# Patient Record
Sex: Female | Born: 1945 | Race: Black or African American | Hispanic: No | Marital: Married | Smoking: Never smoker
Health system: Southern US, Community
[De-identification: ages and names within clinical notes are randomized; demographics above are authoritative.]

## PROBLEM LIST (undated history)

## (undated) DIAGNOSIS — E079 Disorder of thyroid, unspecified: Secondary | ICD-10-CM

## (undated) DIAGNOSIS — F329 Major depressive disorder, single episode, unspecified: Secondary | ICD-10-CM

## (undated) DIAGNOSIS — M1990 Unspecified osteoarthritis, unspecified site: Secondary | ICD-10-CM

## (undated) DIAGNOSIS — R7303 Prediabetes: Secondary | ICD-10-CM

## (undated) DIAGNOSIS — F32A Depression, unspecified: Secondary | ICD-10-CM

## (undated) DIAGNOSIS — F419 Anxiety disorder, unspecified: Secondary | ICD-10-CM

## (undated) DIAGNOSIS — E785 Hyperlipidemia, unspecified: Secondary | ICD-10-CM

## (undated) DIAGNOSIS — I10 Essential (primary) hypertension: Secondary | ICD-10-CM

## (undated) HISTORY — DX: Depression, unspecified: F32.A

## (undated) HISTORY — PX: EYE SURGERY: SHX253

## (undated) HISTORY — PX: CORNEAL TRANSPLANT: SHX108

## (undated) HISTORY — PX: OTHER SURGICAL HISTORY: SHX169

## (undated) HISTORY — PX: TONSILLECTOMY: SUR1361

## (undated) HISTORY — DX: Major depressive disorder, single episode, unspecified: F32.9

## (undated) HISTORY — PX: THYROID SURGERY: SHX805

## (undated) HISTORY — PX: TUMOR REMOVAL: SHX12

## (undated) HISTORY — PX: ABDOMINAL HYSTERECTOMY: SHX81

## (undated) HISTORY — DX: Disorder of thyroid, unspecified: E07.9

---

## 1999-07-24 ENCOUNTER — Encounter: Payer: Self-pay | Admitting: Family Medicine

## 1999-07-24 ENCOUNTER — Ambulatory Visit (HOSPITAL_COMMUNITY): Admission: RE | Admit: 1999-07-24 | Discharge: 1999-07-24 | Payer: Self-pay | Admitting: Family Medicine

## 1999-07-25 ENCOUNTER — Ambulatory Visit (HOSPITAL_COMMUNITY): Admission: RE | Admit: 1999-07-25 | Discharge: 1999-07-25 | Payer: Self-pay | Admitting: Family Medicine

## 1999-07-25 ENCOUNTER — Encounter: Payer: Self-pay | Admitting: Family Medicine

## 1999-08-28 ENCOUNTER — Encounter: Payer: Self-pay | Admitting: *Deleted

## 1999-08-28 ENCOUNTER — Encounter: Admission: RE | Admit: 1999-08-28 | Discharge: 1999-08-28 | Payer: Self-pay | Admitting: *Deleted

## 1999-08-29 ENCOUNTER — Encounter: Payer: Self-pay | Admitting: Thoracic Surgery

## 1999-09-01 ENCOUNTER — Encounter: Payer: Self-pay | Admitting: Thoracic Surgery

## 1999-09-01 ENCOUNTER — Encounter (INDEPENDENT_AMBULATORY_CARE_PROVIDER_SITE_OTHER): Payer: Self-pay | Admitting: *Deleted

## 1999-09-01 ENCOUNTER — Inpatient Hospital Stay (HOSPITAL_COMMUNITY): Admission: RE | Admit: 1999-09-01 | Discharge: 1999-09-06 | Payer: Self-pay | Admitting: Thoracic Surgery

## 1999-09-03 ENCOUNTER — Encounter: Payer: Self-pay | Admitting: Thoracic Surgery

## 1999-09-04 ENCOUNTER — Encounter: Payer: Self-pay | Admitting: Thoracic Surgery

## 1999-09-05 ENCOUNTER — Encounter: Payer: Self-pay | Admitting: Thoracic Surgery

## 1999-09-10 ENCOUNTER — Encounter: Payer: Self-pay | Admitting: Thoracic Surgery

## 1999-09-10 ENCOUNTER — Encounter: Admission: RE | Admit: 1999-09-10 | Discharge: 1999-09-10 | Payer: Self-pay | Admitting: Thoracic Surgery

## 1999-10-03 ENCOUNTER — Encounter: Admission: RE | Admit: 1999-10-03 | Discharge: 1999-10-03 | Payer: Self-pay | Admitting: Thoracic Surgery

## 1999-10-03 ENCOUNTER — Encounter: Payer: Self-pay | Admitting: Thoracic Surgery

## 1999-11-26 ENCOUNTER — Encounter: Admission: RE | Admit: 1999-11-26 | Discharge: 1999-11-26 | Payer: Self-pay | Admitting: Thoracic Surgery

## 1999-11-26 ENCOUNTER — Encounter: Payer: Self-pay | Admitting: Thoracic Surgery

## 2000-01-14 ENCOUNTER — Encounter: Payer: Self-pay | Admitting: Thoracic Surgery

## 2000-01-14 ENCOUNTER — Encounter: Admission: RE | Admit: 2000-01-14 | Discharge: 2000-01-14 | Payer: Self-pay | Admitting: Thoracic Surgery

## 2000-02-24 ENCOUNTER — Encounter: Admission: RE | Admit: 2000-02-24 | Discharge: 2000-02-24 | Payer: Self-pay | Admitting: Thoracic Surgery

## 2000-02-24 ENCOUNTER — Encounter: Payer: Self-pay | Admitting: Thoracic Surgery

## 2000-06-23 ENCOUNTER — Encounter: Admission: RE | Admit: 2000-06-23 | Discharge: 2000-06-23 | Payer: Self-pay | Admitting: Thoracic Surgery

## 2000-06-23 ENCOUNTER — Encounter: Payer: Self-pay | Admitting: Thoracic Surgery

## 2000-09-13 ENCOUNTER — Encounter: Admission: RE | Admit: 2000-09-13 | Discharge: 2000-09-13 | Payer: Self-pay | Admitting: *Deleted

## 2000-09-13 ENCOUNTER — Encounter: Payer: Self-pay | Admitting: *Deleted

## 2001-07-25 ENCOUNTER — Other Ambulatory Visit: Admission: RE | Admit: 2001-07-25 | Discharge: 2001-07-25 | Payer: Self-pay | Admitting: Obstetrics and Gynecology

## 2001-09-14 ENCOUNTER — Encounter: Admission: RE | Admit: 2001-09-14 | Discharge: 2001-09-14 | Payer: Self-pay | Admitting: Obstetrics and Gynecology

## 2001-09-14 ENCOUNTER — Encounter: Payer: Self-pay | Admitting: Obstetrics and Gynecology

## 2001-12-12 ENCOUNTER — Encounter: Admission: RE | Admit: 2001-12-12 | Discharge: 2002-03-12 | Payer: Self-pay | Admitting: Family Medicine

## 2002-08-15 ENCOUNTER — Other Ambulatory Visit: Admission: RE | Admit: 2002-08-15 | Discharge: 2002-08-15 | Payer: Self-pay | Admitting: Obstetrics and Gynecology

## 2002-09-26 ENCOUNTER — Encounter: Payer: Self-pay | Admitting: Obstetrics and Gynecology

## 2002-09-26 ENCOUNTER — Ambulatory Visit (HOSPITAL_COMMUNITY): Admission: RE | Admit: 2002-09-26 | Discharge: 2002-09-26 | Payer: Self-pay | Admitting: Obstetrics and Gynecology

## 2002-12-27 ENCOUNTER — Ambulatory Visit (HOSPITAL_COMMUNITY): Admission: RE | Admit: 2002-12-27 | Discharge: 2002-12-27 | Payer: Self-pay | Admitting: Gastroenterology

## 2003-12-11 ENCOUNTER — Other Ambulatory Visit: Admission: RE | Admit: 2003-12-11 | Discharge: 2003-12-11 | Payer: Self-pay | Admitting: Obstetrics and Gynecology

## 2003-12-14 ENCOUNTER — Ambulatory Visit (HOSPITAL_COMMUNITY): Admission: RE | Admit: 2003-12-14 | Discharge: 2003-12-14 | Payer: Self-pay | Admitting: Obstetrics and Gynecology

## 2004-12-30 ENCOUNTER — Other Ambulatory Visit: Admission: RE | Admit: 2004-12-30 | Discharge: 2004-12-30 | Payer: Self-pay | Admitting: Obstetrics and Gynecology

## 2005-01-07 ENCOUNTER — Ambulatory Visit (HOSPITAL_COMMUNITY): Admission: RE | Admit: 2005-01-07 | Discharge: 2005-01-07 | Payer: Self-pay | Admitting: Obstetrics and Gynecology

## 2006-01-26 ENCOUNTER — Other Ambulatory Visit: Admission: RE | Admit: 2006-01-26 | Discharge: 2006-01-26 | Payer: Self-pay | Admitting: Obstetrics and Gynecology

## 2006-02-16 ENCOUNTER — Ambulatory Visit (HOSPITAL_COMMUNITY): Admission: RE | Admit: 2006-02-16 | Discharge: 2006-02-16 | Payer: Self-pay | Admitting: Obstetrics and Gynecology

## 2006-11-17 ENCOUNTER — Encounter: Admission: RE | Admit: 2006-11-17 | Discharge: 2006-11-17 | Payer: Self-pay

## 2007-02-21 ENCOUNTER — Ambulatory Visit (HOSPITAL_COMMUNITY): Admission: RE | Admit: 2007-02-21 | Discharge: 2007-02-21 | Payer: Self-pay | Admitting: Obstetrics and Gynecology

## 2008-04-04 ENCOUNTER — Ambulatory Visit (HOSPITAL_COMMUNITY): Admission: RE | Admit: 2008-04-04 | Discharge: 2008-04-04 | Payer: Self-pay | Admitting: Obstetrics and Gynecology

## 2009-06-11 ENCOUNTER — Ambulatory Visit (HOSPITAL_COMMUNITY): Admission: RE | Admit: 2009-06-11 | Discharge: 2009-06-11 | Payer: Self-pay | Admitting: Obstetrics and Gynecology

## 2010-09-26 NOTE — Op Note (Signed)
[facility]  Patient:    Virginia Becker, Virginia Becker                      MRN: [id number] Proc. Date: 09/01/99 Adm. Date:  [id number] Attending:  Cameron Proud CC:         Norton Blizzard, M.D. (2 copies)             Stacie Glaze, M.D. 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  OPERATION:  Left thoracotomy resection of left posterior mediastinal mass.  SURGEON:  D. Karle Plumber, M.D.  ASSISTANT:  Eugenia Pancoast, P.A.  ANESTHESIA:  General anesthesia.  DESCRIPTION OF PROCEDURE:  After percutaneous insertion of all monitor lines, the patient underwent general anesthesia.  She was turned tothe left lateral thoracotomy position.  Dual lumen tube was inserted.  Two trocar sites were made in the anterior and posterior axillary line at the seventh intercostal space, and 0 degree scope was inserted, and a large posterior mediastinal mass was noted.  The scope was removed.  A posterolateral thoracotomy was performed, and dissection was carried down through the subcutaneous tissues down to the latissimus dorsi which was divided with electrocautery, and the serratus was reflected anteriorly.  The portion of the sixth rib was taken posteriorly subpericosteally.  A Finochietto retractor was inserted, and the large 9 to 10 cm lesion was seen between T4 and T6.  Dissection was started posteriorly with electrocautery, removing off the fourth, fifth, and sixth ribs and reflecting them anteriorly toward the aorta.  Then dissection was started from the aorta laterally, dissecting it off again down to the fourth, fifth, and sixth ribs and then superiorly.  It appeared to probably come off the fourth intercostal nerve, and this was resected out and removed in total. All bleeding was electrocoagulated.  Several ligation clips  were placed in the area of the resection.  Two intercostal vessels were clipped with ligation clips, and one of them was oversewn with 4-0 silk.  Two chest tubes were placed through the trocar sites and tied in place with 0 silk.  The chest was closed with four pericostals, #1 Dexon in the muscle layer, and Ethicon skin clips.  The patient returned to the recovery room in stable condition. DD:  09/01/99 TD:  09/01/99 Job: [id number]

## 2010-09-26 NOTE — Procedures (Signed)
[place]  Patient:    Virginia Becker, Virginia Becker                      MRN: [id number] Proc. Date: 09/01/99 Adm. Date:  [id number] Attending:  Cameron Proud CC:         Anesthesia Dept                           Procedure Report  PROCEDURE PERFORMED:  Insertion of epidural catheter for postoperative analgesia.  ANESTHESIOLOGIST:  Cliffton Asters. Crews, M.D.  DESCRIPTION OF PROCEDURE:  The patient was brought to the operating room by Dr. Edwyna Shell for resection of a lung mass and/or posterior mediastinal mass. She understood the risks and benefits of epidural analgesia as explained to her by me including those of infection, subarachnoid or intravenous injection and nerve damage.  She wished to have the procedure performed postoperatively.  At the end of the procedure she was maintained in the right lateral position. Her back was prepped in sterile fashion.  At the T9-10 interspace, a 17 gauge Tuohy needle was passed to a loss of resistance to normal saline 3 cc.  There was a negative aspiration of blood or CSF and an 18 gauge epidural catheter was passed through the needle to a depth of 4 cm below the end of the needle. The needle was removed and the catheter was secured.  There was a negative aspiration of blood or CSF from the catheter and a bolus injection of fentanyl 100 mcg in a solution of 0.5% lidocaine was given through the catheter.  The patient was turned supine, suctioned and extubated uneventfully in the operating room.  She will be followed by the anesthesia department for several days until appropriate removal of her epidural catheter is accomplished. DD:  09/01/99 TD:  09/02/99 Job: [id number]

## 2010-09-26 NOTE — Op Note (Signed)
NAME:  Virginia Becker, Virginia Becker                         ACCOUNT NO.:  [id number]   MEDICAL RECORD NO.:  [id number]                   PATIENT TYPE:  AMB   LOCATION:  ENDO                                 FACILITY:  [facility]   PHYSICIAN:  Anselmo Rod, M.D.               DATE OF BIRTH:  [birth year]   DATE OF PROCEDURE:  12/27/2002  DATE OF DISCHARGE:                                 OPERATIVE REPORT   PROCEDURE PERFORMED:  Screening colonoscopy.   ENDOSCOPIST:  Charna Elizabeth, M.D.   INSTRUMENT USED:  Olympus video colonoscope.   INDICATIONS FOR PROCEDURE:  The patient is a 65 year old African-American  female undergoing screening colonoscopy to rule out colonic polyps, masses,  etc.   PREPROCEDURE PREPARATION:  Informed consent was procured from the patient.  The patient was fasted for eight hours prior to the procedure and prepped  with a bottle of magnesium citrate and a gallon of GoLYTELY the night prior  to the procedure.   PREPROCEDURE PHYSICAL:  The patient had stable vital signs.  Neck supple.  Chest clear to auscultation.  S1 and S2 regular.  Abdomen soft with normal  bowel sounds.   DESCRIPTION OF PROCEDURE:  The patient was placed in left lateral decubitus  position and sedated with 60 mg of Demerol and 6 mg of Versed intravenously.  Once the patient was adequately sedated and maintained on low flow oxygen  and continuous cardiac monitoring, the Olympus video colonoscope was  advanced from the rectum to the cecum without difficulty.  There was some  residual stool in the colon.  Multiple washes were done. The patient's  position was changed from the left lateral to the supine position to move  the stool.  The appendicular orifice and ileocecal valve were visualized and  photographed.  The terminal ileum appeared normal.  No masses or polyps were  seen.  There were a few early scattered diverticula seen throughout the  colon.  Small internal hemorrhoids were appreciated on  retroflexion in the  rectum.  The patient tolerated the procedure well without complication.   IMPRESSION:  1. Small nonbleeding internal hemorrhoids.  2. Early scattered diverticulosis.  3. No masses or polyps seen.  4. Normal-appearing terminal ileum.   RECOMMENDATIONS:  1. A high fiber diet with liberal fluid intake has been advocated.  2. Repeat colorectal cancer screening is recommended in the next 10 years     unless the patient develops any abnormal symptoms in the interim.  3. Outpatient follow-up in the next two weeks or earlier if need arises.                                                   Anselmo Rod, M.D.    JNM/MEDQ  D:  12/27/2002  T:  12/27/2002  Job:  [id number]   cc:   Naima A. Normand Sloop, M.D.  [address]  [place]  [place] [address]  Fax: [phone]   Jethro Bastos, M.D.  [address]  [place]  [place] [address]  Fax: [phone]

## 2010-09-26 NOTE — Discharge Summary (Signed)
[place]. [facility]  Patient:    Virginia Becker, Virginia Becker                      MRN: [id number] Adm. Date:  [id number] Attending:  Cameron Proud Dictator:   Sherrie George, P.A. CC:         Cristi Loron, M.D.             Joycelyn Rua, M.D.                           Discharge Summary  DATE OF BIRTH:  Feb 09, 1946  ADMITTING DIAGNOSES: 1. Left lobe lesion, rule out neurogenic tumor, left chest. 2. Hypothyroidism. 3. Hypercholesterolemia.  DISCHARGE DIAGNOSES: 1. Schwannoma, left chest. 2. Hypothyroidism. 3. Hypercholesterolemia.  PROCEDURES:  Left video-assisted thoracoscopy, left thoracotomy with resection of tumor from the left chest wall, September 01, 1999, D. Karle Plumber, M.D.  HISTORY OF PRESENT ILLNESS:  The patient is a 65 year old black female medical patient of Cristi Loron, M.D., and Joycelyn Rua, M.D.  The patient presented with left flank pain.  Workup including CT revealed a left lobe lesion which was thought to be a possible neurogenic tumor.  MRI revealed a 7 to 8 cm left lower lobe lesion and possible neural foramen impingement.  She had no history of tobacco use.  She occasionally uses alcohol.  She had been asymptomatic except for the flank pain.  She has a history of hypothyroidism and an elevated cholesterol.  She is on Synthroid, had no allergies.  She is referred to [facility] and Dr. Karle Plumber.  It was his opinion that it was most likely a neurogenic tumor.  It does not enter the neural foramen though it may require neurosurgeon at the time of surgery.  The best treatment is to proceed with a left thoracotomy and resection of the tumor.  MRI does show good plane, so it was his opinion that it should be fairly easy.  Given the size, it will require a full thoracotomy.  The risks and benefits were discussed in detail and patient was admitted electively at this time to undergo a resection of the tumor.  PAST  MEDICAL HISTORY:  As noted above, included hypothyroidism and hypercholesterolemia.  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  CURRENT MEDICATIONS:  Synthroid 0.175 mg p.o. q.d.  ALLERGIES:  None.  She does note some mild LACTOSE INTOLERANCE.  For further history and physical, please see the dictated note.  HOSPITAL COURSE:  Patient was admitted, taken to the operating room, and underwent the procedure as described above.  She tolerated it well and returned to the recovery room and then transferred to 3300 in satisfactory condition.  She remained hemodynamically stable overnight.  She had a low-grade temperature of 101 the postoperative evening.  The following morning, it was 100.  O2 saturations were 94% on room air.  She was hyponatremic and hypokalemic, and these have been appropriately treated. Hemoglobin was 10.7, hematocrit was 31.  White count was 8600, platelets were 216,000.  Overall, she looked quite good.  Her chest tube was removed, ART line was removed, and she was mobilized.  Pain control was managed with an epidural drip which was placed postoperatively by anesthesia.  She did well with this and the epidural was discontinued on  the second hospital day.  On April 25, the second postoperative day, the chest tube was removed and she was mobilized further.  Foley was discontinued.  By April 26, chest x-ray was stable.  She continued to do well and, by September 05, 1999, it was Dr. Remo Lipps opinion that she had made excellent improvement.  She was transferred to 6700 and plans were made for her to go home on April 28 or April 29.  The pathology showed a schwannoma and the tumor was adjacent to the inked edges but had a round border and, therefore, it appeared to be completely excised.  Patient has done well, has had no further problems, and, as noted above, will aim for discharge in  the next 24 to 48 hours.  DISCHARGE MEDICATIONS:  Patient was on Synthroid and some vitamins at home. She may resume those, Synthroid dose 0.175 mg per day, and she will also go home on Tylox one to two p.o. q.4h. p.r.n.  FOLLOW-UP:  She will return to see Dr. Edwyna Shell in one week, on Wednesday, May 2, at 3:10, with a chest x-ray from the [facility].  She will return to see Dr. Lenise Arena and Dr. Lovell Sheehan at their discretion.  CONDITION ON DISCHARGE:  Improving. DD:  09/05/99 TD:  09/06/99 Job: [id number]

## 2011-04-22 ENCOUNTER — Other Ambulatory Visit (HOSPITAL_COMMUNITY): Payer: Self-pay | Admitting: Obstetrics and Gynecology

## 2011-04-22 DIAGNOSIS — Z1231 Encounter for screening mammogram for malignant neoplasm of breast: Secondary | ICD-10-CM

## 2011-05-27 ENCOUNTER — Other Ambulatory Visit (HOSPITAL_COMMUNITY): Payer: Self-pay | Admitting: Obstetrics and Gynecology

## 2011-05-27 ENCOUNTER — Ambulatory Visit (HOSPITAL_COMMUNITY)
Admission: RE | Admit: 2011-05-27 | Discharge: 2011-05-27 | Disposition: A | Discharge status: Home / Self Care | Payer: Medicare Other | Source: Ambulatory Visit | Attending: Obstetrics and Gynecology | Admitting: Obstetrics and Gynecology

## 2011-05-27 DIAGNOSIS — Z1231 Encounter for screening mammogram for malignant neoplasm of breast: Secondary | ICD-10-CM | POA: Insufficient documentation

## 2011-09-24 ENCOUNTER — Ambulatory Visit
Admission: RE | Admit: 2011-09-24 | Discharge: 2011-09-24 | Disposition: A | Discharge status: Home / Self Care | Payer: Medicare Other | Source: Ambulatory Visit | Attending: Family Medicine | Admitting: Family Medicine

## 2011-09-24 ENCOUNTER — Other Ambulatory Visit: Payer: Self-pay | Admitting: Family Medicine

## 2011-09-24 DIAGNOSIS — R51 Headache: Secondary | ICD-10-CM

## 2011-11-29 ENCOUNTER — Other Ambulatory Visit: Payer: Self-pay | Admitting: Obstetrics and Gynecology

## 2011-11-30 NOTE — Telephone Encounter (Signed)
Virginia Becker, can this pt have this rx

## 2011-12-01 ENCOUNTER — Telehealth: Payer: Self-pay

## 2011-12-01 NOTE — Telephone Encounter (Signed)
TC TO PT PHARMACY FOR RX EST ESTROGENS-METHYLTEST HS 0.625-1.25 MG #30 WITH 6 REFILLS DUE TO ESCRIBE ERROR

## 2012-03-22 ENCOUNTER — Other Ambulatory Visit: Payer: Self-pay | Admitting: Obstetrics and Gynecology

## 2012-03-22 ENCOUNTER — Telehealth: Payer: Self-pay | Admitting: Obstetrics and Gynecology

## 2012-03-22 MED ORDER — EST ESTROGENS-METHYLTEST 0.625-1.25 MG PO TABS: 1.0000 | ORAL_TABLET | Freq: Every day | ORAL | Status: DC

## 2012-03-22 NOTE — Telephone Encounter (Signed)
Pt called, states spoke w/ [facility] and was told that Estrace will be running out and they will not have it any more, pt ?s what to do.  Per EP e-prescribed rx to a different pharmacy, advised if there are any issues to there they do not have it, pt to call, pt voices agreement.

## 2012-04-25 ENCOUNTER — Encounter: Payer: Self-pay | Admitting: Obstetrics and Gynecology

## 2012-04-25 ENCOUNTER — Ambulatory Visit (INDEPENDENT_AMBULATORY_CARE_PROVIDER_SITE_OTHER): Payer: Medicare Other | Admitting: Obstetrics and Gynecology

## 2012-04-25 VITALS — BP 126/84 | Wt 181.0 lb

## 2012-04-25 DIAGNOSIS — Z124 Encounter for screening for malignant neoplasm of cervix: Secondary | ICD-10-CM

## 2012-04-25 NOTE — Progress Notes (Signed)
Last Pap: 04-2011 WNL: Yes Regular Periods:no Contraception: post meno  Monthly Breast exam:no Tetanus<87yrs:yes Nl.Bladder Function:yes Daily BMs:yes Healthy Diet:yes Calcium:no Mammogram:yes Date of Mammogram: 04-2011 Exercise:yes Have often Exercise: not often Seatbelt: yes Abuse at home: no Stressful work:yes Sigmoid-colonoscopy: 2006 Bone Density: No PCP: Dr Johnn Hai Change in PMH: no Change in FMH:no BP 126/84  Wt 181 lb (82.101 kg) .ccoPhysical Examination: Neck - supple, no significant adenopathy Chest - clear to auscultation, no wheezes, rales or rhonchi, symmetric air entry Heart - normal rate, regular rhythm, normal S1, S2, no murmurs, rubs, clicks or gallops Abdomen - soft, nontender, nondistended, no masses or organomegaly Breasts - breasts appear normal, no suspicious masses, no skin or nipple changes or axillary nodes Pelvic - normal external genitalia, vulva, vagina, cervix, uterus and adnexa, atrophic Rectal - normal rectal, no masses Musculoskeletal - no joint tenderness, deformity or swelling Extremities - peripheral pulses normal, no pedal edema, no clubbing or cyanosis Skin - normal coloration and turgor, no rashes, no suspicious skin lesions noted Normal AEX Pt due for mammogram yes it wa scheduled colonoscopy due no Pap done no RT one year Diet and exercise discussed Pt given a rx for yell gel to help with desire and orgasm with IC per her request  Rt for bone density scan

## 2012-04-25 NOTE — Patient Instructions (Signed)

## 2012-05-30 ENCOUNTER — Other Ambulatory Visit: Payer: Medicare Other

## 2012-05-31 ENCOUNTER — Ambulatory Visit (HOSPITAL_COMMUNITY)
Admission: RE | Admit: 2012-05-31 | Discharge: 2012-05-31 | Disposition: A | Discharge status: Home / Self Care | Payer: Medicare Other | Source: Ambulatory Visit | Attending: Obstetrics and Gynecology | Admitting: Obstetrics and Gynecology

## 2012-05-31 DIAGNOSIS — Z124 Encounter for screening for malignant neoplasm of cervix: Secondary | ICD-10-CM

## 2012-05-31 DIAGNOSIS — Z1231 Encounter for screening mammogram for malignant neoplasm of breast: Secondary | ICD-10-CM | POA: Insufficient documentation

## 2012-06-07 ENCOUNTER — Other Ambulatory Visit: Payer: Medicare Other

## 2012-06-07 DIAGNOSIS — M949 Disorder of cartilage, unspecified: Secondary | ICD-10-CM

## 2012-06-07 DIAGNOSIS — M899 Disorder of bone, unspecified: Secondary | ICD-10-CM

## 2012-06-08 LAB — VITAMIN D 25 HYDROXY (VIT D DEFICIENCY, FRACTURES): Vit D, 25-Hydroxy: 63 ng/mL (ref 30–89)

## 2012-06-10 ENCOUNTER — Telehealth: Payer: Self-pay

## 2012-06-10 ENCOUNTER — Other Ambulatory Visit: Payer: Self-pay

## 2012-06-10 ENCOUNTER — Telehealth: Payer: Self-pay | Admitting: Obstetrics and Gynecology

## 2012-06-10 MED ORDER — ESTRADIOL 0.1 MG/GM VA CREA: TOPICAL_CREAM | VAGINAL | Status: DC

## 2012-06-10 NOTE — Telephone Encounter (Signed)
Spoke with pt rgd msg pt wants rx for estrace advised pt will consult with nd and call her back pt voice understanding

## 2012-06-10 NOTE — Telephone Encounter (Signed)
Please see ND note

## 2012-06-10 NOTE — Telephone Encounter (Signed)
Spoke with pt rgd previous call informed rx sent to pharm f/u 3-6 months pt voice understanding

## 2012-06-10 NOTE — Telephone Encounter (Signed)
Lm on vm tcb rgd msg 

## 2012-06-10 NOTE — Telephone Encounter (Signed)
Pt may have Estrace vaginal cream.  One applicator QHS for one week.  Then QOD for one week.  Then 3 times a week for a total of 6 weeks.  Repeat q 2-3 months PRN 3 refill f/u 3-6 months

## 2012-06-10 NOTE — Telephone Encounter (Signed)
rx sent to pharm

## 2012-06-15 ENCOUNTER — Other Ambulatory Visit: Payer: Self-pay | Admitting: Obstetrics and Gynecology

## 2012-06-17 ENCOUNTER — Other Ambulatory Visit: Payer: Self-pay | Admitting: Obstetrics and Gynecology

## 2012-06-17 NOTE — Telephone Encounter (Signed)
Can this pt have this rx? 

## 2012-06-17 NOTE — Telephone Encounter (Signed)
Can this pt have this rx?

## 2012-06-21 ENCOUNTER — Telehealth: Payer: Self-pay

## 2012-06-21 ENCOUNTER — Telehealth: Payer: Self-pay | Admitting: Obstetrics and Gynecology

## 2012-06-21 MED ORDER — ESTRADIOL 0.1 MG/GM VA CREA: TOPICAL_CREAM | VAGINAL | Status: DC

## 2012-06-21 MED ORDER — EST ESTROGENS-METHYLTEST 0.625-1.25 MG PO TABS: 1.0000 | ORAL_TABLET | Freq: Every day | ORAL | Status: DC

## 2012-06-21 NOTE — Telephone Encounter (Signed)
Pt aware of rx being sent to [facility]  Darien Ramus, CMA

## 2012-06-21 NOTE — Telephone Encounter (Signed)
VM from pt needing tablets and not cream.   Sent to pharmacy.  pt advised

## 2012-06-21 NOTE — Telephone Encounter (Signed)
Please see msg and advise thanks 

## 2012-06-22 ENCOUNTER — Telehealth: Payer: Self-pay | Admitting: Obstetrics and Gynecology

## 2012-06-22 NOTE — Telephone Encounter (Signed)
rx called to pharmacy 

## 2013-07-18 ENCOUNTER — Ambulatory Visit
Admission: RE | Admit: 2013-07-18 | Discharge: 2013-07-18 | Disposition: A | Discharge status: Home / Self Care | Payer: Medicare Other | Source: Ambulatory Visit | Attending: Family Medicine | Admitting: Family Medicine

## 2013-07-18 ENCOUNTER — Encounter (HOSPITAL_COMMUNITY): Payer: Self-pay | Admitting: Emergency Medicine

## 2013-07-18 ENCOUNTER — Other Ambulatory Visit: Payer: Self-pay | Admitting: Family Medicine

## 2013-07-18 ENCOUNTER — Inpatient Hospital Stay (HOSPITAL_COMMUNITY)
Admission: EM | Admit: 2013-07-18 | Discharge: 2013-07-23 | DRG: 392 | Disposition: A | Discharge status: Home / Self Care | Payer: Medicare Other | Attending: Internal Medicine | Admitting: Internal Medicine

## 2013-07-18 DIAGNOSIS — R1032 Left lower quadrant pain: Secondary | ICD-10-CM

## 2013-07-18 DIAGNOSIS — K5780 Diverticulitis of intestine, part unspecified, with perforation and abscess without bleeding: Secondary | ICD-10-CM | POA: Diagnosis present

## 2013-07-18 DIAGNOSIS — K648 Other hemorrhoids: Secondary | ICD-10-CM | POA: Diagnosis present

## 2013-07-18 DIAGNOSIS — N390 Urinary tract infection, site not specified: Secondary | ICD-10-CM | POA: Diagnosis present

## 2013-07-18 DIAGNOSIS — K630 Abscess of intestine: Secondary | ICD-10-CM | POA: Diagnosis present

## 2013-07-18 DIAGNOSIS — F3289 Other specified depressive episodes: Secondary | ICD-10-CM | POA: Diagnosis present

## 2013-07-18 DIAGNOSIS — R109 Unspecified abdominal pain: Secondary | ICD-10-CM

## 2013-07-18 DIAGNOSIS — D72829 Elevated white blood cell count, unspecified: Secondary | ICD-10-CM

## 2013-07-18 DIAGNOSIS — I809 Phlebitis and thrombophlebitis of unspecified site: Secondary | ICD-10-CM | POA: Diagnosis not present

## 2013-07-18 DIAGNOSIS — K5792 Diverticulitis of intestine, part unspecified, without perforation or abscess without bleeding: Secondary | ICD-10-CM

## 2013-07-18 DIAGNOSIS — K5732 Diverticulitis of large intestine without perforation or abscess without bleeding: Principal | ICD-10-CM | POA: Diagnosis present

## 2013-07-18 DIAGNOSIS — E039 Hypothyroidism, unspecified: Secondary | ICD-10-CM | POA: Diagnosis present

## 2013-07-18 DIAGNOSIS — F329 Major depressive disorder, single episode, unspecified: Secondary | ICD-10-CM | POA: Diagnosis present

## 2013-07-18 LAB — COMPREHENSIVE METABOLIC PANEL
ALK PHOS: 65 U/L (ref 39–117)
ALT: 31 U/L (ref 0–35)
AST: 26 U/L (ref 0–37)
Albumin: 3.7 g/dL (ref 3.5–5.2)
BILIRUBIN TOTAL: 0.6 mg/dL (ref 0.3–1.2)
BUN: 10 mg/dL (ref 6–23)
CO2: 27 mEq/L (ref 19–32)
CREATININE: 0.93 mg/dL (ref 0.50–1.10)
Calcium: 9.5 mg/dL (ref 8.4–10.5)
Chloride: 97 mEq/L (ref 96–112)
GFR calc Af Amer: 72 mL/min — ABNORMAL LOW (ref >90)
GFR calc non Af Amer: 62 mL/min — ABNORMAL LOW (ref >90)
Glucose, Bld: 124 mg/dL — ABNORMAL HIGH (ref 70–99)
POTASSIUM: 4.4 meq/L (ref 3.7–5.3)
Sodium: 139 mEq/L (ref 137–147)
TOTAL PROTEIN: 7.4 g/dL (ref 6.0–8.3)

## 2013-07-18 LAB — CBC WITH DIFFERENTIAL/PLATELET
BASOS PCT: 0 % (ref 0–1)
Basophils Absolute: 0 10*3/uL (ref 0.0–0.1)
Eosinophils Absolute: 0 10*3/uL (ref 0.0–0.7)
Eosinophils Relative: 0 % (ref 0–5)
HEMATOCRIT: 40.7 % (ref 36.0–46.0)
HEMOGLOBIN: 13.7 g/dL (ref 12.0–15.0)
Lymphocytes Relative: 14 % (ref 12–46)
Lymphs Abs: 1.6 10*3/uL (ref 0.7–4.0)
MCH: 27.7 pg (ref 26.0–34.0)
MCHC: 33.7 g/dL (ref 30.0–36.0)
MCV: 82.4 fL (ref 78.0–100.0)
MONO ABS: 1.2 10*3/uL — AB (ref 0.1–1.0)
MONOS PCT: 11 % (ref 3–12)
NEUTROS PCT: 75 % (ref 43–77)
Neutro Abs: 8.2 10*3/uL — ABNORMAL HIGH (ref 1.7–7.7)
Platelets: 228 10*3/uL (ref 150–400)
RBC: 4.94 MIL/uL (ref 3.87–5.11)
RDW: 13.6 % (ref 11.5–15.5)
WBC: 11 10*3/uL — AB (ref 4.0–10.5)

## 2013-07-18 LAB — LIPASE, BLOOD: LIPASE: 9 U/L — AB (ref 11–59)

## 2013-07-18 MED ORDER — MORPHINE SULFATE 2 MG/ML IJ SOLN: 1.0000 mg | INTRAMUSCULAR | Status: DC | PRN

## 2013-07-18 MED ORDER — ACETAMINOPHEN 325 MG PO TABS
650.0000 mg | ORAL_TABLET | Freq: Once | ORAL | Status: AC
  Administered 2013-07-18: 650 mg via ORAL
  Filled 2013-07-18: qty 2

## 2013-07-18 MED ORDER — ENOXAPARIN SODIUM 40 MG/0.4ML ~~LOC~~ SOLN
40.0000 mg | SUBCUTANEOUS | Status: DC
  Administered 2013-07-18 – 2013-07-21 (×4): 40 mg via SUBCUTANEOUS
  Filled 2013-07-18 (×6): qty 0.4

## 2013-07-18 MED ORDER — ATORVASTATIN CALCIUM 40 MG PO TABS
40.0000 mg | ORAL_TABLET | Freq: Every day | ORAL | Status: DC
  Administered 2013-07-18 – 2013-07-23 (×6): 40 mg via ORAL
  Filled 2013-07-18 (×6): qty 1

## 2013-07-18 MED ORDER — METRONIDAZOLE IN NACL 5-0.79 MG/ML-% IV SOLN
500.0000 mg | Freq: Three times a day (TID) | INTRAVENOUS | Status: DC
  Administered 2013-07-18 – 2013-07-22 (×11): 500 mg via INTRAVENOUS
  Filled 2013-07-18 (×13): qty 100

## 2013-07-18 MED ORDER — SODIUM CHLORIDE 0.9 % IV SOLN
INTRAVENOUS | Status: DC
  Administered 2013-07-18 – 2013-07-20 (×2): via INTRAVENOUS

## 2013-07-18 MED ORDER — LEVOTHYROXINE SODIUM 100 MCG PO TABS
100.0000 ug | ORAL_TABLET | Freq: Every day | ORAL | Status: DC
  Administered 2013-07-19 – 2013-07-23 (×4): 100 ug via ORAL
  Filled 2013-07-18 (×8): qty 1

## 2013-07-18 MED ORDER — METRONIDAZOLE IN NACL 5-0.79 MG/ML-% IV SOLN
500.0000 mg | Freq: Once | INTRAVENOUS | Status: AC
  Administered 2013-07-18: 500 mg via INTRAVENOUS
  Filled 2013-07-18: qty 100

## 2013-07-18 MED ORDER — ZOLPIDEM TARTRATE 5 MG PO TABS
5.0000 mg | ORAL_TABLET | Freq: Every evening | ORAL | Status: DC | PRN
  Administered 2013-07-18 – 2013-07-22 (×5): 5 mg via ORAL
  Filled 2013-07-18 (×6): qty 1

## 2013-07-18 MED ORDER — CIPROFLOXACIN IN D5W 400 MG/200ML IV SOLN
400.0000 mg | Freq: Once | INTRAVENOUS | Status: AC
  Administered 2013-07-18: 400 mg via INTRAVENOUS
  Filled 2013-07-18: qty 200

## 2013-07-18 MED ORDER — IOHEXOL 300 MG/ML  SOLN: 100.0000 mL | Freq: Once | INTRAMUSCULAR | Status: DC | PRN

## 2013-07-18 MED ORDER — ONDANSETRON HCL 4 MG PO TABS: 4.0000 mg | ORAL_TABLET | Freq: Four times a day (QID) | ORAL | Status: DC | PRN

## 2013-07-18 MED ORDER — ACETAMINOPHEN 325 MG PO TABS
650.0000 mg | ORAL_TABLET | Freq: Four times a day (QID) | ORAL | Status: DC | PRN
  Administered 2013-07-19 (×2): 650 mg via ORAL
  Filled 2013-07-18 (×2): qty 2

## 2013-07-18 MED ORDER — CIPROFLOXACIN IN D5W 400 MG/200ML IV SOLN
400.0000 mg | Freq: Two times a day (BID) | INTRAVENOUS | Status: DC
  Administered 2013-07-19 – 2013-07-22 (×7): 400 mg via INTRAVENOUS
  Filled 2013-07-18 (×8): qty 200

## 2013-07-18 MED ORDER — CITALOPRAM HYDROBROMIDE 20 MG PO TABS
20.0000 mg | ORAL_TABLET | Freq: Every day | ORAL | Status: DC
  Administered 2013-07-18 – 2013-07-23 (×6): 20 mg via ORAL
  Filled 2013-07-18 (×6): qty 1

## 2013-07-18 MED ORDER — ONDANSETRON HCL 4 MG/2ML IJ SOLN
4.0000 mg | Freq: Four times a day (QID) | INTRAMUSCULAR | Status: DC | PRN
  Administered 2013-07-19: 4 mg via INTRAVENOUS
  Filled 2013-07-18: qty 2

## 2013-07-18 MED ORDER — ACETAMINOPHEN 650 MG RE SUPP: 650.0000 mg | Freq: Four times a day (QID) | RECTAL | Status: DC | PRN

## 2013-07-18 NOTE — ED Notes (Signed)
Pt presents with intermittent LLQ pain x1 week. Pt denies nausea, vomiting, diarrhea or bloody stool. Pt had an abd CT around noon today, her PCP called and told her to come here for further evaluation due to possible "pus pockets" in her abd

## 2013-07-18 NOTE — Consult Note (Signed)
I have seen and examined the patient and agree with the assessment and plans. Hopefully will improve with IV antibiotics and then can get an outpt CT  Margene Cherian A. Magnus IvanBlackman  MD, FACS

## 2013-07-18 NOTE — H&P (Signed)
[facility] History and Physical  Virginia Becker XBM:[id number] DOB: 1946/02/21 DOA: 07/18/2013  Referring physician: er PCP: Emeterio Reeve, MD   Chief Complaint: abd pain- sent in by PCP after CT scan  HPI: Virginia Becker is a 68 y.o. female in good health Who was sent from PCP with c/o LLQ pain- going on for 10 days.  Saturday, she developed a fever.  She was seen by her PCP on Monday who diagnosised her with diverticulitis and was given cipro/flagyl.  Her blood count showed a WBC count.  Her PCP got a CT scan at that point which showed :Extensive wall thickening is noted in the sigmoid colon. Findings are most consistent with severe diverticulitis. Colonic malignancy cannot be excluded. Focal associated area of necrosis and/or developing small abscess noted. No evidence of colonic obstruction over the sigmoid colon is severely narrowed by the above-described process. Left iliac and retroperitoneal lymphadenopathy  No blood in stool but does have diarrhea.  No vomiting- asking to eat Patient had a colonoscopy about 5 years ago with [facility], she thinks she is due for another soon.  In her records, I found a colonscopy in 2004 by Dr. Loreta Ave: IMPRESSION:  1. Small nonbleeding internal hemorrhoids.  2. Early scattered diverticulosis.  3. No masses or polyps seen.  4. Normal-appearing terminal ileum.  RECOMMENDATIONS:  1. A high fiber diet with liberal fluid intake has been advocated.  2. Repeat colorectal cancer screening is recommended in the next 10 years  unless the patient develops any abnormal symptoms in the interim.  3. Outpatient follow-up in the next two weeks or earlier if need arises.    Review of Systems:  All systems reviewed, negative unless stated above  Past Medical History  Diagnosis Date  . Thyroid disease   . Depression    Past Surgical History  Procedure Laterality Date  . Bone spur    . Corneal transplant    . Eye surgery    . Abdominal hysterectomy     . Thyroid surgery    . Tonsillectomy     Social History:  reports that she has never smoked. She has never used smokeless tobacco. She reports that she does not drink alcohol or use illicit drugs.  No Known Allergies  Family History  Problem Relation Age of Onset  . Cancer Mother     pancreatic  . Mental illness Mother   . Heart disease Father   . Mental illness Father   . Hypertension Sister   . Mental illness Sister   . Hypertension Brother   . Breast cancer Maternal Aunt   . Breast cancer Maternal Aunt      Prior to Admission medications   Medication Sig Start Date End Date Taking? Authorizing Provider  atorvastatin (LIPITOR) 40 MG tablet Take 40 mg by mouth daily.   Yes Historical Provider, MD  cholecalciferol (VITAMIN D) 1000 UNITS tablet Take 1,000 Units by mouth daily.   Yes Historical Provider, MD  ciprofloxacin (CIPRO) 500 MG tablet Take 500 mg by mouth 2 (two) times daily. For 10 days. Started on 07-17-13. On day 2 of therapy   Yes Historical Provider, MD  citalopram (CELEXA) 20 MG tablet Take 20 mg by mouth daily.   Yes Historical Provider, MD  levothyroxine (SYNTHROID, LEVOTHROID) 100 MCG tablet Take 100 mcg by mouth daily.   Yes Historical Provider, MD  metroNIDAZOLE (FLAGYL) 500 MG tablet Take 500 mg by mouth 3 (three) times daily. For 10 days. Patient started  on 07-17-13 on day 2 of therapy   Yes Historical Provider, MD  zolpidem (AMBIEN) 10 MG tablet Take 10 mg by mouth at bedtime as needed.   Yes Historical Provider, MD   Physical Exam: Filed Vitals:   07/18/13 1655  BP: 146/87  Pulse: 89  Temp: 99.8 F (37.7 C)  Resp: 17    BP 146/87  Pulse 89  Temp(Src) 99.8 F (37.7 C) (Oral)  Resp 17  Ht 5\' 8"  (1.727 m)  Wt 78.744 kg (173 lb 9.6 oz)  BMI 26.40 kg/m2  SpO2 100%  General:  Appears calm and comfortable Eyes: PERRL, normal lids, irises & conjunctiva ENT: grossly normal hearing, lips & tongue Neck: no LAD, masses or thyromegaly Cardiovascular: RRR,  no m/r/g. No LE edema. Telemetry: SR, no arrhythmias  Respiratory: CTA bilaterally, no w/r/r. Normal respiratory effort. Abdomen: soft, ntnd, tender to palpation in LLQ Skin: no rash or induration seen on limited exam Musculoskeletal: grossly normal tone BUE/BLE Psychiatric: grossly normal mood and affect, speech fluent and appropriate Neurologic: grossly non-focal.          Labs on Admission:  Basic Metabolic Panel:  Recent Labs Lab 07/18/13 1523  NA 139  K 4.4  CL 97  CO2 27  GLUCOSE 124*  BUN 10  CREATININE 0.93  CALCIUM 9.5   Liver Function Tests:  Recent Labs Lab 07/18/13 1523  AST 26  ALT 31  ALKPHOS 65  BILITOT 0.6  PROT 7.4  ALBUMIN 3.7    Recent Labs Lab 07/18/13 1523  LIPASE 9*   No results found for this basename: AMMONIA,  in the last 168 hours CBC:  Recent Labs Lab 07/18/13 1523  WBC 11.0*  NEUTROABS 8.2*  HGB 13.7  HCT 40.7  MCV 82.4  PLT 228   Cardiac Enzymes: No results found for this basename: CKTOTAL, CKMB, CKMBINDEX, TROPONINI,  in the last 168 hours  BNP (last 3 results) No results found for this basename: PROBNP,  in the last 8760 hours CBG: No results found for this basename: GLUCAP,  in the last 168 hours  Radiological Exams on Admission: Ct Abdomen Pelvis W Contrast  07/18/2013   CLINICAL DATA:  Left lower quadrant pain.  Fever and diarrhea.  EXAM: CT ABDOMEN AND PELVIS WITH CONTRAST  TECHNIQUE: Multidetector CT imaging of the abdomen and pelvis was performed using the standard protocol following bolus administration of intravenous contrast.  CONTRAST:  100 cc Omnipaque 300.  COMPARISON:  None.  FINDINGS: Innumerable hepatic lucencies are noted consistent with cysts. Spleen is normal. Pancreas normal. No biliary distention. Gallbladder is nondistended. Portal and splenic veins patent.  Adrenals normal. Kidneys are normal. No obstructing ureteral stone. Bladder is nondistended.  Left iliac and retroperitoneal lymphadenopathy  cannot be excluded. Largest lymph node measures 1 cm. and is in the periaortic region.  No inflammatory changes are noted in the right lower quadrant. Appendix appears normal. Extensive wall thickening is noted in the sigmoid colon with adjacent region of necrosis and/or abscess. Differential diagnosis includes severe changes of diverticulitis and/or focal malignancy. Colitis would be less likely. Scattered diverticuli are present. There is no evidence of bowel obstruction. No free air.  Mild atelectasis lung bases. Heart size normal. Abdominal wall intact. Degenerative changes lumbar spine  IMPRESSION: 1. Extensive wall thickening is noted in the sigmoid colon. Findings are most consistent with severe diverticulitis. Colonic malignancy cannot be excluded. Focal associated area of necrosis and/or developing small abscess noted. No evidence of colonic obstruction over  the sigmoid colon is severely narrowed by the above-described process.  2.  Left iliac and retroperitoneal lymphadenopathy.   Electronically Signed   By: Maisie Fus  Register   On: 07/18/2013 13:13       Assessment/Plan Active Problems:   Diverticulitis   Hypothyroid   Leukocytosis   Abd pain due to diverticulitis seen by surgery, NPO, IVF, bowel rest, cipro/flagyl, repeat CT scan in a few days per surgery recommendations  Leukocytosis- IV abx- monitor  Hypothyroid- continue home meds  Depression- continue home meds   [facility]  Code Status: full Family Communication: patient/husband Disposition Plan: 2-3 days  Time spent: 75 min  Marlin Canary [facility] Pager [phone]

## 2013-07-18 NOTE — ED Notes (Signed)
[facility] at bedside.

## 2013-07-18 NOTE — Progress Notes (Signed)
Patient arrived to floor at 2014. Alert and oriented x4. No distress noted. Pt made comfortable and oriented to room. All questions answered at this time. Will continue to monitor. Herma ArdMesser, Tyla Burgner RN BSN

## 2013-07-18 NOTE — ED Provider Notes (Signed)
CSN: [id number]     Arrival date & time 07/18/13  1455 History   First MD Initiated Contact with Patient 07/18/13 1541     Chief Complaint  Patient presents with  . Abdominal Pain     (Consider location/radiation/quality/duration/timing/severity/associated sxs/prior Treatment) HPI Comments: Patient is a 68 year old female with history of thyroid disease and depression who presents today with left lower quadrant abdominal pain. She reports that her symptoms have been gradually worsening over the past week - 10 days. She developed a fever on Saturday which prompted her to schedule and appointment with her PCP on Monday. She was seen yesterday and diagnosed with diverticulitis. Blood work was done which showed an elevated WBC count. She was then scheduled for a CT today. The CT showed "pus pockets" in her abdomen and her PCP recommended she come to the ED for further management. She reports that her abd pain is cramping in nature and burns when she eats. She has diarrhea after eating. She reports the diarrhea is nonbloody. She denies nausea, vomiting. She has been taking cipro and flagyl since yesterday.   Patient is a 68 y.o. female presenting with abdominal pain. The history is provided by the patient. No language interpreter was used.  Abdominal Pain Associated symptoms: diarrhea and fever   Associated symptoms: no chest pain, no nausea, no shortness of breath and no vomiting     Past Medical History  Diagnosis Date  . Thyroid disease   . Depression    Past Surgical History  Procedure Laterality Date  . Bone spur    . Corneal transplant    . Eye surgery    . Abdominal hysterectomy    . Thyroid surgery    . Tonsillectomy     Family History  Problem Relation Age of Onset  . Cancer Mother     pancreatic  . Mental illness Mother   . Heart disease Father   . Mental illness Father   . Hypertension Sister   . Mental illness Sister   . Hypertension Brother   . Breast cancer Maternal  Aunt   . Breast cancer Maternal Aunt    History  Substance Use Topics  . Smoking status: Never Smoker   . Smokeless tobacco: Never Used  . Alcohol Use: No   OB History   Grav Para Term Preterm Abortions TAB SAB Ect Mult Living                 Review of Systems  Constitutional: Positive for fever and appetite change.  Respiratory: Negative for shortness of breath.   Cardiovascular: Negative for chest pain.  Gastrointestinal: Positive for abdominal pain and diarrhea. Negative for nausea, vomiting and blood in stool.  All other systems reviewed and are negative.      Allergies  Review of patient's allergies indicates no known allergies.  Home Medications   Current Outpatient Rx  Name  Route  Sig  Dispense  Refill  . atorvastatin (LIPITOR) 40 MG tablet   Oral   Take 40 mg by mouth daily.         . cholecalciferol (VITAMIN D) 1000 UNITS tablet   Oral   Take 1,000 Units by mouth daily.         . ciprofloxacin (CIPRO) 500 MG tablet   Oral   Take 500 mg by mouth 2 (two) times daily. For 10 days. Started on 07-17-13. On day 2 of therapy         .  citalopram (CELEXA) 20 MG tablet   Oral   Take 20 mg by mouth daily.         Marland Kitchen. levothyroxine (SYNTHROID, LEVOTHROID) 100 MCG tablet   Oral   Take 100 mcg by mouth daily.         . metroNIDAZOLE (FLAGYL) 500 MG tablet   Oral   Take 500 mg by mouth 3 (three) times daily. For 10 days. Patient started on 07-17-13 on day 2 of therapy         . zolpidem (AMBIEN) 10 MG tablet   Oral   Take 10 mg by mouth at bedtime as needed.          BP 141/85  Pulse 98  Temp(Src) 100.4 F (38 C) (Oral)  Resp 17  Ht 5\' 8"  (1.727 m)  Wt 173 lb 9.6 oz (78.744 kg)  BMI 26.40 kg/m2  SpO2 97% Physical Exam  Nursing note and vitals reviewed. Constitutional: She is oriented to person, place, and time. She appears well-developed and well-nourished. No distress.  HENT:  Head: Normocephalic and atraumatic.  Right Ear: External ear  normal.  Left Ear: External ear normal.  Nose: Nose normal.  Mouth/Throat: Oropharynx is clear and moist.  Eyes: Conjunctivae are normal.  Neck: Normal range of motion.  Cardiovascular: Normal rate, regular rhythm and normal heart sounds.   Pulmonary/Chest: Effort normal and breath sounds normal. No stridor. No respiratory distress. She has no wheezes. She has no rales.  Abdominal: Soft. She exhibits no distension. There is tenderness in the left lower quadrant. There is guarding (voluntary). There is no rebound.  Musculoskeletal: Normal range of motion.  Neurological: She is alert and oriented to person, place, and time. She has normal strength.  Skin: Skin is warm and dry. She is not diaphoretic. No erythema.  Psychiatric: She has a normal mood and affect. Her behavior is normal.    ED Course  Procedures (including critical care time) Labs Review Labs Reviewed  CULTURE, BLOOD (ROUTINE X 2)  CULTURE, BLOOD (ROUTINE X 2)  CBC WITH DIFFERENTIAL  COMPREHENSIVE METABOLIC PANEL  LIPASE, BLOOD   Imaging Review Ct Abdomen Pelvis W Contrast  07/18/2013   CLINICAL DATA:  Left lower quadrant pain.  Fever and diarrhea.  EXAM: CT ABDOMEN AND PELVIS WITH CONTRAST  TECHNIQUE: Multidetector CT imaging of the abdomen and pelvis was performed using the standard protocol following bolus administration of intravenous contrast.  CONTRAST:  100 cc Omnipaque 300.  COMPARISON:  None.  FINDINGS: Innumerable hepatic lucencies are noted consistent with cysts. Spleen is normal. Pancreas normal. No biliary distention. Gallbladder is nondistended. Portal and splenic veins patent.  Adrenals normal. Kidneys are normal. No obstructing ureteral stone. Bladder is nondistended.  Left iliac and retroperitoneal lymphadenopathy cannot be excluded. Largest lymph node measures 1 cm. and is in the periaortic region.  No inflammatory changes are noted in the right lower quadrant. Appendix appears normal. Extensive wall thickening  is noted in the sigmoid colon with adjacent region of necrosis and/or abscess. Differential diagnosis includes severe changes of diverticulitis and/or focal malignancy. Colitis would be less likely. Scattered diverticuli are present. There is no evidence of bowel obstruction. No free air.  Mild atelectasis lung bases. Heart size normal. Abdominal wall intact. Degenerative changes lumbar spine  IMPRESSION: 1. Extensive wall thickening is noted in the sigmoid colon. Findings are most consistent with severe diverticulitis. Colonic malignancy cannot be excluded. Focal associated area of necrosis and/or developing small abscess noted. No evidence of colonic  obstruction over the sigmoid colon is severely narrowed by the above-described process.  2.  Left iliac and retroperitoneal lymphadenopathy.   Electronically Signed   By: Maisie Fus  Register   On: 07/18/2013 13:13     EKG Interpretation None      4:14 PM Discussed case with Aris Georgia from [facility] who will be down to evaluated patient.   MDM   Final diagnoses:  Diverticulitis  Leukocytosis  Abdominal pain  Hypothyroidism    Patient is a 68 year old female who presents to the ED with abnormal CT findings. CT shows extensive wall thickening noted in the sigmoid colon. Findings most consistent with severe diverticulitis. Colonic malignancy cannot be excluded. Also focal area of necrosis and/or developing small abscess. Surgery was consulted who will watch potential abscess and patient was admitted to medicine. Admission and consultation appreciated. Patient started on IV cipro and flagyl in ED. Vital signs stable at this time. Discussed case with Dr. Lynelle Doctor who agrees with plan. Patient / Family / Caregiver informed of clinical course, understand medical decision-making process, and agree with plan.      Mora Bellman, PA-C 07/19/13 1358

## 2013-07-18 NOTE — Consult Note (Signed)
KIMBERELY MCCANNON [birth year]  [id number].    Requesting MD: Dr. Roselyn Bering Chief Complaint/Reason for Consult:   HPI:  68 year old female with PMH of thyroid disease and depression who presents today with LLQ abdominal pain. She reports that her symptoms have been gradually worsening over the past week to10 days. She developed a fever on Saturday which prompted her to schedule and appointment with her PCP on Monday. She was seen yesterday and diagnosed with diverticulitis and was given cipro/flagyl.  On these medications she has improved somewhat. Blood work was done which showed an elevated WBC count. She was then scheduled for a CT today. The CT showed diverticulitis with possible abscess vs necrosis and her PCP recommended she come to [facility] for further management. She reports that her abd pain is cramping in nature.  No radiating pain.  Alleviating factors include antibiotics, no aggravating factors.   No N/V, but notes non-bloody diarrhea.   She denies nausea, vomiting.  Pending lab work, but WBC was elevated at the PCP's office.   ROS: All systems reviewed and otherwise negative except for as above  Family History  Problem Relation Age of Onset  . Cancer Mother     pancreatic  . Mental illness Mother   . Heart disease Father   . Mental illness Father   . Hypertension Sister   . Mental illness Sister   . Hypertension Brother   . Breast cancer Maternal Aunt   . Breast cancer Maternal Aunt     Past Medical History  Diagnosis Date  . Thyroid disease   . Depression     Past Surgical History  Procedure Laterality Date  . Bone spur    . Corneal transplant    . Eye surgery    . Abdominal hysterectomy    . Thyroid surgery    . Tonsillectomy      Social History:  reports that she has never smoked. She has never used smokeless tobacco. She reports that she does not drink alcohol or use illicit drugs.  Allergies: No Known Allergies   (Not in a hospital admission)  Blood pressure  141/85, pulse 98, temperature 100.4 F (38 C), temperature source Oral, resp. rate 17, height 5\' 8"  (1.727 m), weight 173 lb 9.6 oz (78.744 kg), SpO2 97.00%. Physical Exam: General: pleasant, WD/WN AA female who is laying in bed in NAD HEENT: head is normocephalic, atraumatic.  Sclera are noninjected.  PERRL.  Ears and nose without any masses or lesions.  Mouth is pink and moist, dentures in place Heart: regular, rate, and rhythm.  No obvious murmurs, gallops, or rubs noted.  Palpable pedal pulses bilaterally Lungs: CTAB, no wheezes, rhonchi, or rales noted.  Respiratory effort nonlabored Abd: soft, ND, quite tender in the LLQ, +BS, no masses, hernias, or organomegaly, hysterectomy scar well healed MS: all 4 extremities are symmetrical with no cyanosis, clubbing, or edema. Skin: warm and dry with no masses, lesions, or rashes Psych: A&Ox3 with an appropriate affect.   No results found for this or any previous visit (from the past 48 hour(s)). Ct Abdomen Pelvis W Contrast  07/18/2013   CLINICAL DATA:  Left lower quadrant pain.  Fever and diarrhea.  EXAM: CT ABDOMEN AND PELVIS WITH CONTRAST  TECHNIQUE: Multidetector CT imaging of the abdomen and pelvis was performed using the standard protocol following bolus administration of intravenous contrast.  CONTRAST:  100 cc Omnipaque 300.  COMPARISON:  None.  FINDINGS: Innumerable hepatic lucencies are noted consistent with  cysts. Spleen is normal. Pancreas normal. No biliary distention. Gallbladder is nondistended. Portal and splenic veins patent.  Adrenals normal. Kidneys are normal. No obstructing ureteral stone. Bladder is nondistended.  Left iliac and retroperitoneal lymphadenopathy cannot be excluded. Largest lymph node measures 1 cm. and is in the periaortic region.  No inflammatory changes are noted in the right lower quadrant. Appendix appears normal. Extensive wall thickening is noted in the sigmoid colon with adjacent region of necrosis and/or  abscess. Differential diagnosis includes severe changes of diverticulitis and/or focal malignancy. Colitis would be less likely. Scattered diverticuli are present. There is no evidence of bowel obstruction. No free air.  Mild atelectasis lung bases. Heart size normal. Abdominal wall intact. Degenerative changes lumbar spine  IMPRESSION: 1. Extensive wall thickening is noted in the sigmoid colon. Findings are most consistent with severe diverticulitis. Colonic malignancy cannot be excluded. Focal associated area of necrosis and/or developing small abscess noted. No evidence of colonic obstruction over the sigmoid colon is severely narrowed by the above-described process.  2.  Left iliac and retroperitoneal lymphadenopathy.   Electronically Signed   By: [name]  Register   On: 07/18/2013 13:13      Assessment/Plan LLQ abdominal pain Wall thickening sigmoid colon - likely diverticulitis Focal necrosis/developing abscess Thyroid condition  Plan: 1.  Being admitted to medicine team, we will consult 2.  NPO, bowel rest, IVF, pain control, antiemetics, antibiotics (cipro/flagyl) 3.  Ambulate and IS 4.  Hopefully she can be treated conservative without surgical intervention.  If she acutely worsens may need surgical intervention.  May need repeat CT in a few days to a week to see if "abscess" is worsening.   Aris GeorgiaRT, Kimanh Templeman, [facility] 07/18/2013, 4:27 PM Pager: [phone]

## 2013-07-19 LAB — BASIC METABOLIC PANEL
BUN: 10 mg/dL (ref 6–23)
CHLORIDE: 101 meq/L (ref 96–112)
CO2: 25 mEq/L (ref 19–32)
Calcium: 8.7 mg/dL (ref 8.4–10.5)
Creatinine, Ser: 0.77 mg/dL (ref 0.50–1.10)
GFR calc Af Amer: >90 mL/min (ref >90)
GFR, EST NON AFRICAN AMERICAN: 85 mL/min — AB (ref >90)
Glucose, Bld: 115 mg/dL — ABNORMAL HIGH (ref 70–99)
Potassium: 3.9 mEq/L (ref 3.7–5.3)
SODIUM: 139 meq/L (ref 137–147)

## 2013-07-19 LAB — CBC
HEMATOCRIT: 37.7 % (ref 36.0–46.0)
Hemoglobin: 12.2 g/dL (ref 12.0–15.0)
MCH: 26.8 pg (ref 26.0–34.0)
MCHC: 32.4 g/dL (ref 30.0–36.0)
MCV: 82.7 fL (ref 78.0–100.0)
Platelets: 221 10*3/uL (ref 150–400)
RBC: 4.56 MIL/uL (ref 3.87–5.11)
RDW: 13.7 % (ref 11.5–15.5)
WBC: 8.8 10*3/uL (ref 4.0–10.5)

## 2013-07-19 MED ORDER — IBUPROFEN 400 MG PO TABS
400.0000 mg | ORAL_TABLET | Freq: Four times a day (QID) | ORAL | Status: DC | PRN
  Administered 2013-07-19: 400 mg via ORAL
  Filled 2013-07-19 (×2): qty 1

## 2013-07-19 NOTE — Progress Notes (Signed)
   CARE MANAGEMENT NOTE 07/19/2013  Patient:  Virginia Becker,Virginia Becker   Account Number:  [id number]  Date Initiated:  07/19/2013  Documentation initiated by:  Jiles CrockerHANDLER,Shahzaib Azevedo  Subjective/Objective Assessment:   ADMITTED WITH DIVERTICULITIS     Action/Plan:   CM FOLLOWING FOR DCP   Anticipated DC Date:  07/26/2013   Anticipated DC Plan:  HOME/SELF CARE      DC Planning Services  CM consult       Status of service:  In process, will continue to follow Medicare Important Message given?  NA - LOS <3 / Initial given by admissions (If response is "NO", the following Medicare IM given date fields will be blank)  Per UR Regulation:  Reviewed for med. necessity/level of care/duration of stay  Comments:  3/11/2015Abelino Derrick- Becker Latashia Koch RN,BSN,MHA [phone]

## 2013-07-19 NOTE — Progress Notes (Signed)
Subjective: Had a fever, but much less abdominal pain Passing flatus  Objective: Vital signs in last 24 hours: Temp:  [98.9 F (37.2 C)-101.4 F (38.6 C)] 99.1 F (37.3 C) (03/11 0721) Pulse Rate:  [51-107] 92 (03/11 0606) Resp:  [14-21] 18 (03/11 0606) BP: (117-146)/(61-88) 134/85 mmHg (03/11 0606) SpO2:  [94 %-100 %] 98 % (03/11 0606) Weight:  [173 lb 9.6 oz (78.744 kg)] 173 lb 9.6 oz (78.744 kg) (03/10 1517) Last BM Date: 07/18/13  Intake/Output from previous day:   Intake/Output this shift:    Abdomen soft, much less tender than yesterday. Minimal guarding  Lab Results:   Recent Labs  07/18/13 1523 07/19/13 0653  WBC 11.0* 8.8  HGB 13.7 12.2  HCT 40.7 37.7  PLT 228 221   BMET  Recent Labs  07/18/13 1523 07/19/13 0653  NA 139 139  K 4.4 3.9  CL 97 101  CO2 27 25  GLUCOSE 124* 115*  BUN 10 10  CREATININE 0.93 0.77  CALCIUM 9.5 8.7   PT/INR No results found for this basename: LABPROT, INR,  in the last 72 hours ABG No results found for this basename: PHART, PCO2, PO2, HCO3,  in the last 72 hours  Studies/Results: Ct Abdomen Pelvis W Contrast  07/18/2013   CLINICAL DATA:  Left lower quadrant pain.  Fever and diarrhea.  EXAM: CT ABDOMEN AND PELVIS WITH CONTRAST  TECHNIQUE: Multidetector CT imaging of the abdomen and pelvis was performed using the standard protocol following bolus administration of intravenous contrast.  CONTRAST:  100 cc Omnipaque 300.  COMPARISON:  None.  FINDINGS: Innumerable hepatic lucencies are noted consistent with cysts. Spleen is normal. Pancreas normal. No biliary distention. Gallbladder is nondistended. Portal and splenic veins patent.  Adrenals normal. Kidneys are normal. No obstructing ureteral stone. Bladder is nondistended.  Left iliac and retroperitoneal lymphadenopathy cannot be excluded. Largest lymph node measures 1 cm. and is in the periaortic region.  No inflammatory changes are noted in the right lower quadrant.  Appendix appears normal. Extensive wall thickening is noted in the sigmoid colon with adjacent region of necrosis and/or abscess. Differential diagnosis includes severe changes of diverticulitis and/or focal malignancy. Colitis would be less likely. Scattered diverticuli are present. There is no evidence of bowel obstruction. No free air.  Mild atelectasis lung bases. Heart size normal. Abdominal wall intact. Degenerative changes lumbar spine  IMPRESSION: 1. Extensive wall thickening is noted in the sigmoid colon. Findings are most consistent with severe diverticulitis. Colonic malignancy cannot be excluded. Focal associated area of necrosis and/or developing small abscess noted. No evidence of colonic obstruction over the sigmoid colon is severely narrowed by the above-described process.  2.  Left iliac and retroperitoneal lymphadenopathy.   Electronically Signed   By: Maisie Fus  Register   On: 07/18/2013 13:13    Anti-infectives: Anti-infectives   Start     Dose/Rate Route Frequency Ordered Stop   07/19/13 0600  ciprofloxacin (CIPRO) IVPB 400 mg     400 mg 200 mL/hr over 60 Minutes Intravenous Every 12 hours 07/18/13 2014     07/18/13 2200  metroNIDAZOLE (FLAGYL) IVPB 500 mg     500 mg 100 mL/hr over 60 Minutes Intravenous Every 8 hours 07/18/13 2014     07/18/13 1600  ciprofloxacin (CIPRO) IVPB 400 mg     400 mg 200 mL/hr over 60 Minutes Intravenous  Once 07/18/13 1557 07/18/13 1938   07/18/13 1600  metroNIDAZOLE (FLAGYL) IVPB 500 mg     500 mg 100  mL/hr over 60 Minutes Intravenous  Once 07/18/13 1557 07/18/13 1740      Assessment/Plan: s/p * No surgery found *  Diverticulitis  Will allow liquids.  Continue IV antibiotics. If she continues to improve, can consider transition to oral antibiotics with discharge tomorrow  LOS: 1 day    Virginia Becker A 07/19/2013

## 2013-07-19 NOTE — Progress Notes (Signed)
[facility] PROGRESS NOTE  Virginia Becker ZOX:[id number] DOB: 1945-07-17 DOA: 07/18/2013 PCP: Emeterio Reeve, MD  Assessment/Plan: #1 acute severe diverticulitis with concern to necrosis and abscess formation Patient with some clinical improvement however still spiking fevers. Blood cultures are pending. Patient has been started on IV Cipro and IV Flagyl. Patient on clear liquids per general surgery. Follow. The patient continues to spike fevers may need to re\re CT patient's abdomen and pelvis or broaden the antibiotic coverage. General surgeon following and appreciate input and recommendations.  #2 hypothyroidism Continue Synthroid.  #3 leukocytosis Likely secondary to problem #1. Blood cultures are pending. Continue empiric IV ciprofloxacin and IV Flagyl.  #4 depression Continue Celexa.  Code Status: Full Family Communication: Updated patient no family at bedside. Disposition Plan: Home when medically stable.   Consultants:  General surgery: Dr. Magnus Ivan 07/18/2013  Procedures:  CT abdomen and pelvis 07/18/2013  Antibiotics:  IV Cipro 07/18/2013  IV Flagyl 07/18/2013  HPI/Subjective: Patient states nausea is improved. Some improvement in abdominal pain.  Objective: Filed Vitals:   07/19/13 1745  BP:   Pulse:   Temp: 103 F (39.4 C)  Resp:    No intake or output data in the 24 hours ending 07/19/13 1834 Filed Weights   07/18/13 1517  Weight: 78.744 kg (173 lb 9.6 oz)    Exam:   General:  NAD  Cardiovascular: RRR  Respiratory: CTAB  Abdomen: Soft, nondistended, positive bowel sounds. Left lower quadrant tender to palpation.  Musculoskeletal: No clubbing cyanosis or edema.  Data Reviewed: Basic Metabolic Panel:  Recent Labs Lab 07/18/13 1523 07/19/13 0653  NA 139 139  K 4.4 3.9  CL 97 101  CO2 27 25  GLUCOSE 124* 115*  BUN 10 10  CREATININE 0.93 0.77  CALCIUM 9.5 8.7   Liver Function Tests:  Recent Labs Lab 07/18/13 1523   AST 26  ALT 31  ALKPHOS 65  BILITOT 0.6  PROT 7.4  ALBUMIN 3.7    Recent Labs Lab 07/18/13 1523  LIPASE 9*   No results found for this basename: AMMONIA,  in the last 168 hours CBC:  Recent Labs Lab 07/18/13 1523 07/19/13 0653  WBC 11.0* 8.8  NEUTROABS 8.2*  --   HGB 13.7 12.2  HCT 40.7 37.7  MCV 82.4 82.7  PLT 228 221   Cardiac Enzymes: No results found for this basename: CKTOTAL, CKMB, CKMBINDEX, TROPONINI,  in the last 168 hours BNP (last 3 results) No results found for this basename: PROBNP,  in the last 8760 hours CBG: No results found for this basename: GLUCAP,  in the last 168 hours  Recent Results (from the past 240 hour(s))  CULTURE, BLOOD (ROUTINE X 2)     Status: None   Collection Time    07/18/13  4:05 PM      Result Value Ref Range Status   Specimen Description BLOOD LEFT FOREARM   Final   Special Requests BOTTLES DRAWN AEROBIC AND ANAEROBIC 5CC   Final   Culture  Setup Time     Final   Value: 07/18/2013 23:01     Performed at [facility]   Culture     Final   Value:        BLOOD CULTURE RECEIVED NO GROWTH TO DATE CULTURE WILL BE HELD FOR 5 DAYS BEFORE ISSUING A FINAL NEGATIVE REPORT     Performed at [facility]   Report Status PENDING   Incomplete  CULTURE, BLOOD (ROUTINE X 2)  Status: None   Collection Time    07/18/13  4:17 PM      Result Value Ref Range Status   Specimen Description BLOOD LEFT ARM   Final   Special Requests BOTTLES DRAWN AEROBIC AND ANAEROBIC 5 CC   Final   Culture  Setup Time     Final   Value: 07/18/2013 20:00     [facility]   Culture     Final   Value:        BLOOD CULTURE RECEIVED NO GROWTH TO DATE CULTURE WILL BE HELD FOR 5 DAYS BEFORE ISSUING A FINAL NEGATIVE REPORT     [facility]   Report Status PENDING   Incomplete     Studies: Ct Abdomen Pelvis W Contrast  07/18/2013   CLINICAL DATA:  Left lower quadrant pain.  Fever and diarrhea.  EXAM: CT  ABDOMEN AND PELVIS WITH CONTRAST  TECHNIQUE: Multidetector CT imaging of the abdomen and pelvis was performed using the standard protocol following bolus administration of intravenous contrast.  CONTRAST:  100 cc Omnipaque 300.  COMPARISON:  None.  FINDINGS: Innumerable hepatic lucencies are noted consistent with cysts. Spleen is normal. Pancreas normal. No biliary distention. Gallbladder is nondistended. Portal and splenic veins patent.  Adrenals normal. Kidneys are normal. No obstructing ureteral stone. Bladder is nondistended.  Left iliac and retroperitoneal lymphadenopathy cannot be excluded. Largest lymph node measures 1 cm. and is in the periaortic region.  No inflammatory changes are noted in the right lower quadrant. Appendix appears normal. Extensive wall thickening is noted in the sigmoid colon with adjacent region of necrosis and/or abscess. Differential diagnosis includes severe changes of diverticulitis and/or focal malignancy. Colitis would be less likely. Scattered diverticuli are present. There is no evidence of bowel obstruction. No free air.  Mild atelectasis lung bases. Heart size normal. Abdominal wall intact. Degenerative changes lumbar spine  IMPRESSION: 1. Extensive wall thickening is noted in the sigmoid colon. Findings are most consistent with severe diverticulitis. Colonic malignancy cannot be excluded. Focal associated area of necrosis and/or developing small abscess noted. No evidence of colonic obstruction over the sigmoid colon is severely narrowed by the above-described process.  2.  Left iliac and retroperitoneal lymphadenopathy.   Electronically Signed   By: [name]  Register   On: 07/18/2013 13:13    Scheduled Meds: . atorvastatin  40 mg Oral Daily  . ciprofloxacin  400 mg Intravenous Q12H  . citalopram  20 mg Oral Daily  . enoxaparin (LOVENOX) injection  40 mg Subcutaneous Q24H  . levothyroxine  100 mcg Oral QAC breakfast  . metronidazole  500 mg Intravenous Q8H    Continuous Infusions: . sodium chloride 75 mL/hr at 07/18/13 2015    Principal Problem:   Diverticulitis Active Problems:   Hypothyroid   Leukocytosis    Time spent: 35 mins    [facility] Community HospitalHOMPSON,Quinterrius Errington MD [facility] Pager [phone]. If 7PM-7AM, please contact night-coverage at [email or web address], password [email or web address] 07/19/2013, 6:34 PM  LOS: 1 day

## 2013-07-20 DIAGNOSIS — N390 Urinary tract infection, site not specified: Secondary | ICD-10-CM

## 2013-07-20 LAB — CBC
HCT: 37.4 % (ref 36.0–46.0)
HEMOGLOBIN: 12.2 g/dL (ref 12.0–15.0)
MCH: 27.1 pg (ref 26.0–34.0)
MCHC: 32.6 g/dL (ref 30.0–36.0)
MCV: 82.9 fL (ref 78.0–100.0)
Platelets: 207 10*3/uL (ref 150–400)
RBC: 4.51 MIL/uL (ref 3.87–5.11)
RDW: 13.7 % (ref 11.5–15.5)
WBC: 10 10*3/uL (ref 4.0–10.5)

## 2013-07-20 LAB — URINALYSIS, ROUTINE W REFLEX MICROSCOPIC
Bilirubin Urine: NEGATIVE
Glucose, UA: NEGATIVE mg/dL
Hgb urine dipstick: NEGATIVE
Ketones, ur: 40 mg/dL — AB
NITRITE: POSITIVE — AB
PH: 5.5 (ref 5.0–8.0)
Protein, ur: NEGATIVE mg/dL
SPECIFIC GRAVITY, URINE: 1.02 (ref 1.005–1.030)
UROBILINOGEN UA: 0.2 mg/dL (ref 0.0–1.0)

## 2013-07-20 LAB — BASIC METABOLIC PANEL
BUN: 10 mg/dL (ref 6–23)
CHLORIDE: 100 meq/L (ref 96–112)
CO2: 26 mEq/L (ref 19–32)
Calcium: 9 mg/dL (ref 8.4–10.5)
Creatinine, Ser: 0.77 mg/dL (ref 0.50–1.10)
GFR, EST NON AFRICAN AMERICAN: 85 mL/min — AB (ref >90)
Glucose, Bld: 97 mg/dL (ref 70–99)
Potassium: 4 mEq/L (ref 3.7–5.3)
SODIUM: 139 meq/L (ref 137–147)

## 2013-07-20 LAB — URINE MICROSCOPIC-ADD ON

## 2013-07-20 NOTE — Progress Notes (Signed)
Subjective: Still with fevers Minimal pain Emesis x 1 yesterday  Objective: Vital signs in last 24 hours: Temp:  [97.7 F (36.5 C)-103 F (39.4 C)] 98 F (36.7 C) (03/12 0551) Pulse Rate:  [77-95] 77 (03/12 0551) Resp:  [18] 18 (03/12 0551) BP: (120-132)/(60-81) 120/67 mmHg (03/12 0551) SpO2:  [93 %-98 %] 93 % (03/12 0551) Last BM Date: 07/18/13  Intake/Output from previous day:   Intake/Output this shift:    Abdomen soft with minimal LLQ tenderness and guarding  Lab Results:   Recent Labs  07/19/13 0653 07/20/13 0507  WBC 8.8 10.0  HGB 12.2 12.2  HCT 37.7 37.4  PLT 221 207   BMET  Recent Labs  07/19/13 0653 07/20/13 0507  NA 139 139  K 3.9 4.0  CL 101 100  CO2 25 26  GLUCOSE 115* 97  BUN 10 10  CREATININE 0.77 0.77  CALCIUM 8.7 9.0   PT/INR No results found for this basename: LABPROT, INR,  in the last 72 hours ABG No results found for this basename: PHART, PCO2, PO2, HCO3,  in the last 72 hours  Studies/Results: Ct Abdomen Pelvis W Contrast  07/18/2013   CLINICAL DATA:  Left lower quadrant pain.  Fever and diarrhea.  EXAM: CT ABDOMEN AND PELVIS WITH CONTRAST  TECHNIQUE: Multidetector CT imaging of the abdomen and pelvis was performed using the standard protocol following bolus administration of intravenous contrast.  CONTRAST:  100 cc Omnipaque 300.  COMPARISON:  None.  FINDINGS: Innumerable hepatic lucencies are noted consistent with cysts. Spleen is normal. Pancreas normal. No biliary distention. Gallbladder is nondistended. Portal and splenic veins patent.  Adrenals normal. Kidneys are normal. No obstructing ureteral stone. Bladder is nondistended.  Left iliac and retroperitoneal lymphadenopathy cannot be excluded. Largest lymph node measures 1 cm. and is in the periaortic region.  No inflammatory changes are noted in the right lower quadrant. Appendix appears normal. Extensive wall thickening is noted in the sigmoid colon with adjacent region of  necrosis and/or abscess. Differential diagnosis includes severe changes of diverticulitis and/or focal malignancy. Colitis would be less likely. Scattered diverticuli are present. There is no evidence of bowel obstruction. No free air.  Mild atelectasis lung bases. Heart size normal. Abdominal wall intact. Degenerative changes lumbar spine  IMPRESSION: 1. Extensive wall thickening is noted in the sigmoid colon. Findings are most consistent with severe diverticulitis. Colonic malignancy cannot be excluded. Focal associated area of necrosis and/or developing small abscess noted. No evidence of colonic obstruction over the sigmoid colon is severely narrowed by the above-described process.  2.  Left iliac and retroperitoneal lymphadenopathy.   Electronically Signed   By: [name]  Register   On: 07/18/2013 13:13    Anti-infectives: Anti-infectives   Start     Dose/Rate Route Frequency Ordered Stop   07/19/13 0600  ciprofloxacin (CIPRO) IVPB 400 mg     400 mg 200 mL/hr over 60 Minutes Intravenous Every 12 hours 07/18/13 2014     07/18/13 2200  metroNIDAZOLE (FLAGYL) IVPB 500 mg     500 mg 100 mL/hr over 60 Minutes Intravenous Every 8 hours 07/18/13 2014     07/18/13 1600  ciprofloxacin (CIPRO) IVPB 400 mg     400 mg 200 mL/hr over 60 Minutes Intravenous  Once 07/18/13 1557 07/18/13 1938   07/18/13 1600  metroNIDAZOLE (FLAGYL) IVPB 500 mg     500 mg 100 mL/hr over 60 Minutes Intravenous  Once 07/18/13 1557 07/18/13 1740      Assessment/Plan: Diverticulitis  If she continues to spike fevers, will change IV antibiotics and consider repeat CT scan.  Keep on clear liquids  LOS: 2 days    Virginia Becker 07/20/2013

## 2013-07-20 NOTE — Progress Notes (Signed)
[facility] PROGRESS NOTE  Virginia Becker ZOX:[id number] DOB: [birth year] DOA: 07/18/2013 PCP: Emeterio Reeve, MD  Assessment/Plan: #1 acute severe diverticulitis with concern to necrosis and abscess formation Patient with some clinical improvement however still spiking fevers as of yesterday. Blood cultures are pending. Patient has been started on IV Cipro and IV Flagyl. Patient on clear liquids per general surgery. Follow. If patient continues to spike fevers may need to re\re CT patient's abdomen and pelvis and broaden the antibiotic coverage to invanz. General surgeon following and appreciate input and recommendations.  #2 hypothyroidism Continue Synthroid.  #3 leukocytosis Likely secondary to problem #1 and probable UTI. Blood cultures are pending. Urine cultures pending. Continue empiric IV ciprofloxacin and IV Flagyl.  #4 probable UTI Patient currently on IV Cipro. Urine cultures pending.  #5 depression Continue Celexa.  Code Status: Full Family Communication: Updated patient and niece at bedside. Disposition Plan: Home when medically stable.   Consultants:  General surgery: Dr. Magnus Ivan 07/18/2013  Procedures:  CT abdomen and pelvis 07/18/2013  Antibiotics:  IV Cipro 07/18/2013  IV Flagyl 07/18/2013  HPI/Subjective: Patient states nausea is improved. Some improvement in abdominal pain. Patient had an episode of emesis yesterday after trial of clears. Patient states no emesis today after clears for breakfast. Patient feels abdominal pain is improving. Patient also noted to be spiking fevers yesterday.  Objective: Filed Vitals:   07/20/13 1052  BP: 125/52  Pulse: 86  Temp: 98.1 F (36.7 C)  Resp: 18    Intake/Output Summary (Last 24 hours) at 07/20/13 1144 Last data filed at 07/20/13 0900  Gross per 24 hour  Intake     75 ml  Output      0 ml  Net     75 ml   Filed Weights   07/18/13 1517  Weight: 78.744 kg (173 lb 9.6 oz)     Exam:   General:  NAD  Cardiovascular: RRR  Respiratory: CTAB  Abdomen: Soft, nondistended, positive bowel sounds. Left lower quadrant less tender to palpation.  Musculoskeletal: No clubbing cyanosis or edema.  Data Reviewed: Basic Metabolic Panel:  Recent Labs Lab 07/18/13 1523 07/19/13 0653 07/20/13 0507  NA 139 139 139  K 4.4 3.9 4.0  CL 97 101 100  CO2 27 25 26   GLUCOSE 124* 115* 97  BUN 10 10 10   CREATININE 0.93 0.77 0.77  CALCIUM 9.5 8.7 9.0   Liver Function Tests:  Recent Labs Lab 07/18/13 1523  AST 26  ALT 31  ALKPHOS 65  BILITOT 0.6  PROT 7.4  ALBUMIN 3.7    Recent Labs Lab 07/18/13 1523  LIPASE 9*   No results found for this basename: AMMONIA,  in the last 168 hours CBC:  Recent Labs Lab 07/18/13 1523 07/19/13 0653 07/20/13 0507  WBC 11.0* 8.8 10.0  NEUTROABS 8.2*  --   --   HGB 13.7 12.2 12.2  HCT 40.7 37.7 37.4  MCV 82.4 82.7 82.9  PLT 228 221 207   Cardiac Enzymes: No results found for this basename: CKTOTAL, CKMB, CKMBINDEX, TROPONINI,  in the last 168 hours BNP (last 3 results) No results found for this basename: PROBNP,  in the last 8760 hours CBG: No results found for this basename: GLUCAP,  in the last 168 hours  Recent Results (from the past 240 hour(s))  CULTURE, BLOOD (ROUTINE X 2)     Status: None   Collection Time    07/18/13  4:05 PM      Result Value  Ref Range Status   Specimen Description BLOOD LEFT FOREARM   Final   Special Requests BOTTLES DRAWN AEROBIC AND ANAEROBIC 5CC   Final   Culture  Setup Time     Final   Value: 07/18/2013 23:01     Performed at [facility]   Culture     Final   Value:        BLOOD CULTURE RECEIVED NO GROWTH TO DATE CULTURE WILL BE HELD FOR 5 DAYS BEFORE ISSUING A FINAL NEGATIVE REPORT     Performed at [facility]   Report Status PENDING   Incomplete  CULTURE, BLOOD (ROUTINE X 2)     Status: None   Collection Time    07/18/13  4:17 PM      Result Value Ref  Range Status   Specimen Description BLOOD LEFT ARM   Final   Special Requests BOTTLES DRAWN AEROBIC AND ANAEROBIC 5 CC   Final   Culture  Setup Time     Final   Value: 07/18/2013 20:00     Performed at [facility]   Culture     Final   Value:        BLOOD CULTURE RECEIVED NO GROWTH TO DATE CULTURE WILL BE HELD FOR 5 DAYS BEFORE ISSUING A FINAL NEGATIVE REPORT     Performed at [facility]   Report Status PENDING   Incomplete     Studies: Ct Abdomen Pelvis W Contrast  07/18/2013   CLINICAL DATA:  Left lower quadrant pain.  Fever and diarrhea.  EXAM: CT ABDOMEN AND PELVIS WITH CONTRAST  TECHNIQUE: Multidetector CT imaging of the abdomen and pelvis was performed using the standard protocol following bolus administration of intravenous contrast.  CONTRAST:  100 cc Omnipaque 300.  COMPARISON:  None.  FINDINGS: Innumerable hepatic lucencies are noted consistent with cysts. Spleen is normal. Pancreas normal. No biliary distention. Gallbladder is nondistended. Portal and splenic veins patent.  Adrenals normal. Kidneys are normal. No obstructing ureteral stone. Bladder is nondistended.  Left iliac and retroperitoneal lymphadenopathy cannot be excluded. Largest lymph node measures 1 cm. and is in the periaortic region.  No inflammatory changes are noted in the right lower quadrant. Appendix appears normal. Extensive wall thickening is noted in the sigmoid colon with adjacent region of necrosis and/or abscess. Differential diagnosis includes severe changes of diverticulitis and/or focal malignancy. Colitis would be less likely. Scattered diverticuli are present. There is no evidence of bowel obstruction. No free air.  Mild atelectasis lung bases. Heart size normal. Abdominal wall intact. Degenerative changes lumbar spine  IMPRESSION: 1. Extensive wall thickening is noted in the sigmoid colon. Findings are most consistent with severe diverticulitis. Colonic malignancy cannot be excluded. Focal  associated area of necrosis and/or developing small abscess noted. No evidence of colonic obstruction over the sigmoid colon is severely narrowed by the above-described process.  2.  Left iliac and retroperitoneal lymphadenopathy.   Electronically Signed   By: [name]  Register   On: 07/18/2013 13:13    Scheduled Meds: . atorvastatin  40 mg Oral Daily  . ciprofloxacin  400 mg Intravenous Q12H  . citalopram  20 mg Oral Daily  . enoxaparin (LOVENOX) injection  40 mg Subcutaneous Q24H  . levothyroxine  100 mcg Oral QAC breakfast  . metronidazole  500 mg Intravenous Q8H   Continuous Infusions: . sodium chloride 75 mL/hr at 07/18/13 2015    Principal Problem:   Diverticulitis Active Problems:   Hypothyroid  Leukocytosis    Time spent: 35 mins    [facility] MD [facility] Pager [phone]. If 7PM-7AM, please contact night-coverage at [email or web address], password [facility] 07/20/2013, 11:44 AM  LOS: 2 days

## 2013-07-21 LAB — BASIC METABOLIC PANEL
BUN: 8 mg/dL (ref 6–23)
CO2: 26 mEq/L (ref 19–32)
Calcium: 8.9 mg/dL (ref 8.4–10.5)
Chloride: 100 mEq/L (ref 96–112)
Creatinine, Ser: 0.74 mg/dL (ref 0.50–1.10)
GFR calc non Af Amer: 86 mL/min — ABNORMAL LOW (ref >90)
Glucose, Bld: 81 mg/dL (ref 70–99)
POTASSIUM: 4.5 meq/L (ref 3.7–5.3)
SODIUM: 140 meq/L (ref 137–147)

## 2013-07-21 LAB — CBC
HCT: 37 % (ref 36.0–46.0)
HEMOGLOBIN: 12.1 g/dL (ref 12.0–15.0)
MCH: 27 pg (ref 26.0–34.0)
MCHC: 32.7 g/dL (ref 30.0–36.0)
MCV: 82.6 fL (ref 78.0–100.0)
Platelets: 204 10*3/uL (ref 150–400)
RBC: 4.48 MIL/uL (ref 3.87–5.11)
RDW: 13.5 % (ref 11.5–15.5)
WBC: 5.2 10*3/uL (ref 4.0–10.5)

## 2013-07-21 LAB — URINE CULTURE
COLONY COUNT: NO GROWTH
CULTURE: NO GROWTH

## 2013-07-21 MED ORDER — ENSURE COMPLETE PO LIQD: 237.0000 mL | ORAL | Status: DC

## 2013-07-21 NOTE — Progress Notes (Signed)
Nutrition Education Note  RD consulted for nutrition education regarding nutrition management for diverticulosis/ Diverticulitis.    RD provided "Low Fiber Nutrition Therapy" handout as well as "5 Sample Menus for Gradually Increasing Fiber" handout from the Academy of [employer] and [employer]etics. Reviewed patient's dietary recall and discussed ways for pt to meet nutrition goals over the next several weeks. Explained reasons for pt to follow a low fiber diet over the next 1 to 2 weeks. Reviewed low fiber foods and high fiber foods. Discussed best practice for long term management of diverticulosis is a high fiber diet and discussed ways to gradually increased fiber in the diet.   Teach back method used. Pt verbalizes understanding of information provided.   Expect very good compliance.  Body mass index is Body mass index is 26.4 kg/(m^2).Marland Kitchen. Pt meets criteria for Overweight based on current BMI.  Current diet order is Full Liquid, patient is consuming approximately 75% of meals at this time. Pt reports feeling week due to decreased PO intake PTA; will add Ensure Complete for pt. Labs and medications reviewed. No further nutrition interventions warranted at this time. RD contact information provided. If additional nutrition issues arise, please re-consult RD.  Ian Malkineanne Barnett RD, LDN Inpatient Clinical Dietitian Pager: [phone] After Hours Pager: [phone]

## 2013-07-21 NOTE — Progress Notes (Signed)
Patient ID: Virginia HarpsLinda B Leibensperger, female   DOB: 02/06/1946, 68 y.o.   MRN: [id number]    Subjective: Pt feels well today.  No complaints except pain at old IV site  Objective: Vital signs in last 24 hours: Temp:  [98.1 F (36.7 C)-98.7 F (37.1 C)] 98.6 F (37 C) (03/13 0555) Pulse Rate:  [74-86] 83 (03/13 0555) Resp:  [18-20] 18 (03/13 0555) BP: (123-146)/(52-83) 143/83 mmHg (03/13 0555) SpO2:  [95 %-98 %] 98 % (03/13 0555) Last BM Date: 07/20/13  Intake/Output from previous day: 03/12 0701 - 03/13 0700 In: 335 [P.O.:335] Out: -  Intake/Output this shift:    PE: Abd: soft, NT, ND, +BS Ext: erythema and cord-like feeling of vein from old IV site  Lab Results:   Recent Labs  07/20/13 0507 07/21/13 0513  WBC 10.0 5.2  HGB 12.2 12.1  HCT 37.4 37.0  PLT 207 204   BMET  Recent Labs  07/20/13 0507 07/21/13 0513  NA 139 140  K 4.0 4.5  CL 100 100  CO2 26 26  GLUCOSE 97 81  BUN 10 8  CREATININE 0.77 0.74  CALCIUM 9.0 8.9   PT/INR No results found for this basename: LABPROT, INR,  in the last 72 hours CMP     Component Value Date/Time   NA 140 07/21/2013 0513   K 4.5 07/21/2013 0513   CL 100 07/21/2013 0513   CO2 26 07/21/2013 0513   GLUCOSE 81 07/21/2013 0513   BUN 8 07/21/2013 0513   CREATININE 0.74 07/21/2013 0513   CALCIUM 8.9 07/21/2013 0513   PROT 7.4 07/18/2013 1523   ALBUMIN 3.7 07/18/2013 1523   AST 26 07/18/2013 1523   ALT 31 07/18/2013 1523   ALKPHOS 65 07/18/2013 1523   BILITOT 0.6 07/18/2013 1523   GFRNONAA 86* 07/21/2013 0513   GFRAA >90 07/21/2013 0513   Lipase     Component Value Date/Time   LIPASE 9* 07/18/2013 1523       Studies/Results: No results found.  Anti-infectives: Anti-infectives   Start     Dose/Rate Route Frequency Ordered Stop   07/19/13 0600  ciprofloxacin (CIPRO) IVPB 400 mg     400 mg 200 mL/hr over 60 Minutes Intravenous Every 12 hours 07/18/13 2014     07/18/13 2200  metroNIDAZOLE (FLAGYL) IVPB 500 mg     500 mg 100  mL/hr over 60 Minutes Intravenous Every 8 hours 07/18/13 2014     07/18/13 1600  ciprofloxacin (CIPRO) IVPB 400 mg     400 mg 200 mL/hr over 60 Minutes Intravenous  Once 07/18/13 1557 07/18/13 1938   07/18/13 1600  metroNIDAZOLE (FLAGYL) IVPB 500 mg     500 mg 100 mL/hr over 60 Minutes Intravenous  Once 07/18/13 1557 07/18/13 1740       Assessment/Plan  1. Diverticulitis 2. Phlebitis of old IV site  Plan: 1. Warm compressed to arm.  Doubt she will need anything further for this 2. Advance to full liquids as she seems to be improving with her tics.  She has been AF for over 24 hrs.  Consult to dietitian for diet teaching.   LOS: 3 days    Klynn Linnemann E 07/21/2013, 10:33 AM Pager: [phone]

## 2013-07-21 NOTE — Progress Notes (Signed)
I have seen and examined the patient and agree with the assessment and plans. With this improvement, she may be able to transition to oral antibiotics in 1 to 2 days  Jad Johansson A. Magnus IvanBlackman  MD, FACS

## 2013-07-21 NOTE — Progress Notes (Signed)
[facility] PROGRESS NOTE  Virginia HarpsLinda B Becker EAV:[id number] DOB: 03/28/1946 DOA: 07/18/2013 PCP: Emeterio ReeveWOLTERS,[name] A, MD  Assessment/Plan: #1 acute severe diverticulitis with concern to necrosis and abscess formation Patient with some clinical improvement. Afebrile x24 hours. Blood cultures are pending. Continue IV Cipro and IV Flagyl. Patient tolerating clear liquids. Follow. If patient continues to spike fevers may need to re\re CT patient's abdomen and pelvis and broaden the antibiotic coverage to invanz. Diet has been advanced to full liquid diet per general surgery. General surgeon following and appreciate input and recommendations.  #2 hypothyroidism Continue Synthroid.  #3 leukocytosis Likely secondary to problem #1. Blood cultures are pending. Urine cultures negative. Continue empiric IV ciprofloxacin and IV Flagyl.  #4 probable UTI/bacteriuria Patient currently on IV Cipro. Urine cultures negative..  #5 depression Continue Celexa.  Code Status: Full Family Communication: Updated patient at bedside. Disposition Plan: Home when medically stable.   Consultants:  General surgery: Dr. Magnus IvanBlackman 07/18/2013  Procedures:  CT abdomen and pelvis 07/18/2013  Antibiotics:  IV Cipro 07/18/2013  IV Flagyl 07/18/2013  HPI/Subjective: Patient states nausea is improved. Patient feeling better. No emesis. Tolerating current diet.  Objective: Filed Vitals:   07/21/13 1350  BP: 154/88  Pulse: 68  Temp: 98.6 F (37 C)  Resp: 18   No intake or output data in the 24 hours ending 07/21/13 1820 Filed Weights   07/18/13 1517  Weight: 78.744 kg (173 lb 9.6 oz)    Exam:   General:  NAD  Cardiovascular: RRR  Respiratory: CTAB  Abdomen: Soft, nondistended, positive bowel sounds, NTTP.   Musculoskeletal: No clubbing cyanosis or edema.  Data Reviewed: Basic Metabolic Panel:  Recent Labs Lab 07/18/13 1523 07/19/13 0653 07/20/13 0507 07/21/13 0513  NA 139 139 139  140  K 4.4 3.9 4.0 4.5  CL 97 101 100 100  CO2 27 25 26 26   GLUCOSE 124* 115* 97 81  BUN 10 10 10 8   CREATININE 0.93 0.77 0.77 0.74  CALCIUM 9.5 8.7 9.0 8.9   Liver Function Tests:  Recent Labs Lab 07/18/13 1523  AST 26  ALT 31  ALKPHOS 65  BILITOT 0.6  PROT 7.4  ALBUMIN 3.7    Recent Labs Lab 07/18/13 1523  LIPASE 9*   No results found for this basename: AMMONIA,  in the last 168 hours CBC:  Recent Labs Lab 07/18/13 1523 07/19/13 0653 07/20/13 0507 07/21/13 0513  WBC 11.0* 8.8 10.0 5.2  NEUTROABS 8.2*  --   --   --   HGB 13.7 12.2 12.2 12.1  HCT 40.7 37.7 37.4 37.0  MCV 82.4 82.7 82.9 82.6  PLT 228 221 207 204   Cardiac Enzymes: No results found for this basename: CKTOTAL, CKMB, CKMBINDEX, TROPONINI,  in the last 168 hours BNP (last 3 results) No results found for this basename: PROBNP,  in the last 8760 hours CBG: No results found for this basename: GLUCAP,  in the last 168 hours  Recent Results (from the past 240 hour(s))  CULTURE, BLOOD (ROUTINE X 2)     Status: None   Collection Time    07/18/13  4:05 PM      Result Value Ref Range Status   Specimen Description BLOOD LEFT FOREARM   Final   Special Requests BOTTLES DRAWN AEROBIC AND ANAEROBIC 5CC   Final   Culture  Setup Time     Final   Value: 07/18/2013 23:01     Performed at [facility]   Culture  Final   Value:        BLOOD CULTURE RECEIVED NO GROWTH TO DATE CULTURE WILL BE HELD FOR 5 DAYS BEFORE ISSUING A FINAL NEGATIVE REPORT     Performed at [facility]   Report Status PENDING   Incomplete  CULTURE, BLOOD (ROUTINE X 2)     Status: None   Collection Time    07/18/13  4:17 PM      Result Value Ref Range Status   Specimen Description BLOOD LEFT ARM   Final   Special Requests BOTTLES DRAWN AEROBIC AND ANAEROBIC 5 CC   Final   Culture  Setup Time     Final   Value: 07/18/2013 20:00     Performed at [facility]   Culture     Final   Value:        BLOOD  CULTURE RECEIVED NO GROWTH TO DATE CULTURE WILL BE HELD FOR 5 DAYS BEFORE ISSUING A FINAL NEGATIVE REPORT     Performed at [facility]   Report Status PENDING   Incomplete  URINE CULTURE     Status: None   Collection Time    07/20/13 12:00 PM      Result Value Ref Range Status   Specimen Description URINE, CLEAN CATCH   Final   Special Requests NONE   Final   Culture  Setup Time     Final   Value: 07/20/2013 12:47     Performed at [facility] Count     Final   Value: NO GROWTH     Performed at [facility]   Culture     Final   Value: NO GROWTH     Performed at [facility]   Report Status 07/21/2013 FINAL   Final     Studies: No results found.  Scheduled Meds: . atorvastatin  40 mg Oral Daily  . ciprofloxacin  400 mg Intravenous Q12H  . citalopram  20 mg Oral Daily  . enoxaparin (LOVENOX) injection  40 mg Subcutaneous Q24H  . feeding supplement (ENSURE COMPLETE)  237 mL Oral Q24H  . levothyroxine  100 mcg Oral QAC breakfast  . metronidazole  500 mg Intravenous Q8H   Continuous Infusions: . sodium chloride 75 mL/hr at 07/20/13 1422    Principal Problem:   Diverticulitis Active Problems:   Hypothyroid   Leukocytosis   UTI (urinary tract infection)    Time spent: 35 mins    [facility] MD [facility] Pager [phone]. If 7PM-7AM, please contact night-coverage at [email or web address], password [facility] 07/21/2013, 6:20 PM  LOS: 3 days

## 2013-07-22 LAB — CBC
HCT: 35.4 % — ABNORMAL LOW (ref 36.0–46.0)
Hemoglobin: 11.8 g/dL — ABNORMAL LOW (ref 12.0–15.0)
MCH: 27.3 pg (ref 26.0–34.0)
MCHC: 33.3 g/dL (ref 30.0–36.0)
MCV: 81.9 fL (ref 78.0–100.0)
Platelets: 208 10*3/uL (ref 150–400)
RBC: 4.32 MIL/uL (ref 3.87–5.11)
RDW: 13.4 % (ref 11.5–15.5)
WBC: 3.9 10*3/uL — ABNORMAL LOW (ref 4.0–10.5)

## 2013-07-22 LAB — BASIC METABOLIC PANEL
BUN: 7 mg/dL (ref 6–23)
CHLORIDE: 101 meq/L (ref 96–112)
CO2: 26 meq/L (ref 19–32)
Calcium: 8.8 mg/dL (ref 8.4–10.5)
Creatinine, Ser: 0.68 mg/dL (ref 0.50–1.10)
GFR calc Af Amer: >90 mL/min (ref >90)
GFR, EST NON AFRICAN AMERICAN: 89 mL/min — AB (ref >90)
GLUCOSE: 97 mg/dL (ref 70–99)
POTASSIUM: 3.9 meq/L (ref 3.7–5.3)
SODIUM: 142 meq/L (ref 137–147)

## 2013-07-22 MED ORDER — METRONIDAZOLE 500 MG PO TABS
500.0000 mg | ORAL_TABLET | Freq: Three times a day (TID) | ORAL | Status: DC
  Administered 2013-07-22 – 2013-07-23 (×4): 500 mg via ORAL
  Filled 2013-07-22 (×6): qty 1

## 2013-07-22 MED ORDER — CIPROFLOXACIN HCL 500 MG PO TABS
500.0000 mg | ORAL_TABLET | Freq: Two times a day (BID) | ORAL | Status: DC
  Administered 2013-07-22 – 2013-07-23 (×2): 500 mg via ORAL
  Filled 2013-07-22 (×4): qty 1

## 2013-07-22 NOTE — Progress Notes (Signed)
Patient ID: Virginia Becker, female   DOB: [birth year], 68 y.o.   MRN: [id number] [facility] Surgery Progress Note:   * No surgery found *  Subjective: Mental status is clear and alert.   Objective: Vital signs in last 24 hours: Temp:  [97.7 F (36.5 C)-98.6 F (37 C)] 97.7 F (36.5 C) (03/14 0557) Pulse Rate:  [68-72] 72 (03/14 0557) Resp:  [18] 18 (03/14 0557) BP: (137-154)/(74-88) 137/77 mmHg (03/14 0557) SpO2:  [96 %-98 %] 98 % (03/14 0557)  Intake/Output from previous day:   Intake/Output this shift:    Physical Exam: Work of breathing is not labored.  Abdominal pain is much better.  Taking diet although there may be some dietary mixups with the cafeteria.    Lab Results:  Results for orders placed during the hospital encounter of 07/18/13 (from the past 48 hour(s))  URINALYSIS, ROUTINE W REFLEX MICROSCOPIC     Status: Abnormal   Collection Time    07/20/13 12:00 PM      Result Value Ref Range   Color, Urine AMBER (*) YELLOW   Comment: BIOCHEMICALS MAY BE AFFECTED BY COLOR   APPearance CLOUDY (*) CLEAR   Specific Gravity, Urine 1.020  1.005 - 1.030   pH 5.5  5.0 - 8.0   Glucose, UA NEGATIVE  NEGATIVE mg/dL   Hgb urine dipstick NEGATIVE  NEGATIVE   Bilirubin Urine NEGATIVE  NEGATIVE   Ketones, ur 40 (*) NEGATIVE mg/dL   Protein, ur NEGATIVE  NEGATIVE mg/dL   Urobilinogen, UA 0.2  0.0 - 1.0 mg/dL   Nitrite POSITIVE (*) NEGATIVE   Leukocytes, UA MODERATE (*) NEGATIVE  URINE CULTURE     Status: None   Collection Time    07/20/13 12:00 PM      Result Value Ref Range   Specimen Description URINE, CLEAN CATCH     Special Requests NONE     Culture  Setup Time       Value: 07/20/2013 12:47     Performed at [facility]       Value: NO GROWTH     Performed at [facility]   Culture       Value: NO GROWTH     Performed at [facility]   Report Status 07/21/2013 FINAL    URINE MICROSCOPIC-ADD ON     Status: Abnormal   Collection Time    07/20/13 12:00 PM      Result Value Ref Range   WBC, UA 11-20  <3 WBC/hpf   RBC / HPF 3-6  <3 RBC/hpf   Bacteria, UA FEW (*) RARE   Casts HYALINE CASTS (*) NEGATIVE   Comment: GRANULAR CAST   Urine-Other MUCOUS PRESENT    BASIC METABOLIC PANEL     Status: Abnormal   Collection Time    07/21/13  5:13 AM      Result Value Ref Range   Sodium 140  137 - 147 mEq/L   Potassium 4.5  3.7 - 5.3 mEq/L   Chloride 100  96 - 112 mEq/L   CO2 26  19 - 32 mEq/L   Glucose, Bld 81  70 - 99 mg/dL   BUN 8  6 - 23 mg/dL   Creatinine, Ser 0.74  0.50 - 1.10 mg/dL   Calcium 8.9  8.4 - 10.5 mg/dL   GFR calc non Af Amer 86 (*) >90 mL/min   GFR calc Af Amer >90  >90 mL/min  Comment: (NOTE)     The eGFR has been calculated using the CKD EPI equation.     This calculation has not been validated in all clinical situations.     eGFR's persistently <90 mL/min signify possible Chronic Kidney     Disease.  CBC     Status: None   Collection Time    07/21/13  5:13 AM      Result Value Ref Range   WBC 5.2  4.0 - 10.5 K/uL   RBC 4.48  3.87 - 5.11 MIL/uL   Hemoglobin 12.1  12.0 - 15.0 g/dL   HCT 37.0  36.0 - 46.0 %   MCV 82.6  78.0 - 100.0 fL   MCH 27.0  26.0 - 34.0 pg   MCHC 32.7  30.0 - 36.0 g/dL   RDW 13.5  11.5 - 15.5 %   Platelets 204  150 - 400 K/uL  BASIC METABOLIC PANEL     Status: Abnormal   Collection Time    07/22/13  5:35 AM      Result Value Ref Range   Sodium 142  137 - 147 mEq/L   Potassium 3.9  3.7 - 5.3 mEq/L   Chloride 101  96 - 112 mEq/L   CO2 26  19 - 32 mEq/L   Glucose, Bld 97  70 - 99 mg/dL   BUN 7  6 - 23 mg/dL   Creatinine, Ser 0.68  0.50 - 1.10 mg/dL   Calcium 8.8  8.4 - 10.5 mg/dL   GFR calc non Af Amer 89 (*) >90 mL/min   GFR calc Af Amer >90  >90 mL/min   Comment: (NOTE)     The eGFR has been calculated using the CKD EPI equation.     This calculation has not been validated in all clinical situations.     eGFR's persistently <90 mL/min signify  possible Chronic Kidney     Disease.  CBC     Status: Abnormal   Collection Time    07/22/13  5:35 AM      Result Value Ref Range   WBC 3.9 (*) 4.0 - 10.5 K/uL   RBC 4.32  3.87 - 5.11 MIL/uL   Hemoglobin 11.8 (*) 12.0 - 15.0 g/dL   HCT 35.4 (*) 36.0 - 46.0 %   MCV 81.9  78.0 - 100.0 fL   MCH 27.3  26.0 - 34.0 pg   MCHC 33.3  30.0 - 36.0 g/dL   RDW 13.4  11.5 - 15.5 %   Platelets 208  150 - 400 K/uL    Radiology/Results: No results found.  Anti-infectives: Anti-infectives   Start     Dose/Rate Route Frequency Ordered Stop   07/19/13 0600  ciprofloxacin (CIPRO) IVPB 400 mg     400 mg 200 mL/hr over 60 Minutes Intravenous Every 12 hours 07/18/13 2014     07/18/13 2200  metroNIDAZOLE (FLAGYL) IVPB 500 mg     500 mg 100 mL/hr over 60 Minutes Intravenous Every 8 hours 07/18/13 2014     07/18/13 1600  ciprofloxacin (CIPRO) IVPB 400 mg     400 mg 200 mL/hr over 60 Minutes Intravenous  Once 07/18/13 1557 07/18/13 1938   07/18/13 1600  metroNIDAZOLE (FLAGYL) IVPB 500 mg     500 mg 100 mL/hr over 60 Minutes Intravenous  Once 07/18/13 1557 07/18/13 1740      Assessment/Plan: Problem List: Patient Active Problem List   Diagnosis Date Noted  . UTI (urinary tract infection) 07/20/2013  .  Diverticulitis 07/18/2013  . Hypothyroid 07/18/2013  . Leukocytosis 07/18/2013    OK for discharge on oral Cipro/Flagyl and low residue diet.    * No surgery found *    LOS: 4 days   Matt B. Hassell Done, MD, [facility] [phone] beeper [phone]  07/22/2013 10:01 AM

## 2013-07-22 NOTE — Progress Notes (Signed)
[facility] PROGRESS NOTE  Virginia Becker ZOX:[id number] DOB: 1945-09-19 DOA: 07/18/2013 PCP: Emeterio Reeve, MD  Assessment/Plan: #1 acute severe diverticulitis with concern to necrosis and abscess formation Patient with some clinical improvement. Afebrile x24 hours. Blood cultures are pending. Patient tolerating full liquids. Patient with clinical improvement. Will advance diet to a low residue soft diet. Will change IV Cipro and Flagyl to oral Cipro and Flagyl. General surgeon following and appreciate input and recommendations.  #2 hypothyroidism Continue Synthroid.  #3 leukocytosis Likely secondary to problem #1. Blood cultures are pending. Urine cultures negative. Continue empiric antibiotics.   #4 probable UTI/ Patient currently on IV Cipro. Urine cultures negative, however patient was on antibiotics one to 2 days prior to urinalysis.  #5 depression Continue Celexa.  Code Status: Full Family Communication: Updated patient at bedside. Disposition Plan: Home when medically stable.   Consultants:  General surgery: Dr. Magnus Ivan 07/18/2013  Procedures:  CT abdomen and pelvis 07/18/2013  Antibiotics:  IV Cipro 07/18/2013---> 07/22/13  IV Flagyl 07/18/2013--->07/22/13  Oral ciprofloxacin 07/22/2013  Oral Flagyl 07/22/2013  HPI/Subjective: Patient states nausea is improved. Patient feeling better. Tolerating current diet.  Objective: Filed Vitals:   07/22/13 0557  BP: 137/77  Pulse: 72  Temp: 97.7 F (36.5 C)  Resp: 18   No intake or output data in the 24 hours ending 07/22/13 1143 Filed Weights   07/18/13 1517  Weight: 78.744 kg (173 lb 9.6 oz)    Exam:   General:  NAD  Cardiovascular: RRR  Respiratory: CTAB  Abdomen: Soft, nondistended, positive bowel sounds, NTTP.   Musculoskeletal: No clubbing cyanosis or edema.  Data Reviewed: Basic Metabolic Panel:  Recent Labs Lab 07/18/13 1523 07/19/13 0653 07/20/13 0507 07/21/13 0513  07/22/13 0535  NA 139 139 139 140 142  K 4.4 3.9 4.0 4.5 3.9  CL 97 101 100 100 101  CO2 27 25 26 26 26   GLUCOSE 124* 115* 97 81 97  BUN 10 10 10 8 7   CREATININE 0.93 0.77 0.77 0.74 0.68  CALCIUM 9.5 8.7 9.0 8.9 8.8   Liver Function Tests:  Recent Labs Lab 07/18/13 1523  AST 26  ALT 31  ALKPHOS 65  BILITOT 0.6  PROT 7.4  ALBUMIN 3.7    Recent Labs Lab 07/18/13 1523  LIPASE 9*   No results found for this basename: AMMONIA,  in the last 168 hours CBC:  Recent Labs Lab 07/18/13 1523 07/19/13 0653 07/20/13 0507 07/21/13 0513 07/22/13 0535  WBC 11.0* 8.8 10.0 5.2 3.9*  NEUTROABS 8.2*  --   --   --   --   HGB 13.7 12.2 12.2 12.1 11.8*  HCT 40.7 37.7 37.4 37.0 35.4*  MCV 82.4 82.7 82.9 82.6 81.9  PLT 228 221 207 204 208   Cardiac Enzymes: No results found for this basename: CKTOTAL, CKMB, CKMBINDEX, TROPONINI,  in the last 168 hours BNP (last 3 results) No results found for this basename: PROBNP,  in the last 8760 hours CBG: No results found for this basename: GLUCAP,  in the last 168 hours  Recent Results (from the past 240 hour(s))  CULTURE, BLOOD (ROUTINE X 2)     Status: None   Collection Time    07/18/13  4:05 PM      Result Value Ref Range Status   Specimen Description BLOOD LEFT FOREARM   Final   Special Requests BOTTLES DRAWN AEROBIC AND ANAEROBIC 5CC   Final   Culture  Setup Time  Final   Value: 07/18/2013 23:01     [facility]   Culture     Final   Value:        BLOOD CULTURE RECEIVED NO GROWTH TO DATE CULTURE WILL BE HELD FOR 5 DAYS BEFORE ISSUING A FINAL NEGATIVE REPORT     [facility]formed at [facility]   Report Status PENDING   Incomplete  CULTURE, BLOOD (ROUTINE X 2)     Status: None   Collection Time    07/18/13  4:17 PM      Result Value Ref Range Status   Specimen Description BLOOD LEFT ARM   Final   Special Requests BOTTLES DRAWN AEROBIC AND ANAEROBIC 5 CC   Final   Culture  Setup Time     Final   Value:  07/18/2013 20:00     [facility]formed at [facility]   Culture     Final   Value:        BLOOD CULTURE RECEIVED NO GROWTH TO DATE CULTURE WILL BE HELD FOR 5 DAYS BEFORE ISSUING A FINAL NEGATIVE REPORT     [facility]formed at [facility]   Report Status PENDING   Incomplete  URINE CULTURE     Status: None   Collection Time    07/20/13 12:00 PM      Result Value Ref Range Status   Specimen Description URINE, CLEAN CATCH   Final   Special Requests NONE   Final   Culture  Setup Time     Final   Value: 07/20/2013 12:47     Performed at [facility]   Colony Count     Final   Value: NO GROWTH     Performed at [facility]   Culture     Final   Value: NO GROWTH     Performed at [facility]   Report Status 07/21/2013 FINAL   Final     Studies: No results found.  Scheduled Meds: . atorvastatin  40 mg Oral Daily  . ciprofloxacin  400 mg Intravenous Q12H  . citalopram  20 mg Oral Daily  . enoxaparin (LOVENOX) injection  40 mg Subcutaneous Q24H  . feeding supplement (ENSURE COMPLETE)  237 mL Oral Q24H  . levothyroxine  100 mcg Oral QAC breakfast  . metronidazole  500 mg Intravenous Q8H   Continuous Infusions:    Principal Problem:   Diverticulitis Active Problems:   Hypothyroid   Leukocytosis   UTI (urinary tract infection)    Time spent: 35 mins    [facility] HospitalHOMPSON,Ioanna Colquhoun MD [facility] Pager [phone]. If 7PM-7AM, please contact night-coverage at [email or web address], password [email or web address] 07/22/2013, 11:43 AM  LOS: 4 days

## 2013-07-22 NOTE — ED Provider Notes (Signed)
Medical screening examination/treatment/procedure(s) were performed by non-physician practitioner and as supervising physician I was immediately available for consultation/collaboration.    Celene KrasJon R Gareth Fitzner, MD 07/22/13 [phone]

## 2013-07-23 DIAGNOSIS — K630 Abscess of intestine: Secondary | ICD-10-CM

## 2013-07-23 DIAGNOSIS — R109 Unspecified abdominal pain: Secondary | ICD-10-CM | POA: Diagnosis present

## 2013-07-23 LAB — BASIC METABOLIC PANEL
BUN: 5 mg/dL — ABNORMAL LOW (ref 6–23)
CALCIUM: 9.2 mg/dL (ref 8.4–10.5)
CO2: 29 meq/L (ref 19–32)
Chloride: 99 mEq/L (ref 96–112)
Creatinine, Ser: 0.73 mg/dL (ref 0.50–1.10)
GFR calc Af Amer: >90 mL/min (ref >90)
GFR calc non Af Amer: 86 mL/min — ABNORMAL LOW (ref >90)
GLUCOSE: 110 mg/dL — AB (ref 70–99)
Potassium: 3.8 mEq/L (ref 3.7–5.3)
SODIUM: 141 meq/L (ref 137–147)

## 2013-07-23 MED ORDER — CIPROFLOXACIN HCL 500 MG PO TABS: 500.0000 mg | ORAL_TABLET | Freq: Two times a day (BID) | ORAL | Status: DC

## 2013-07-23 MED ORDER — METRONIDAZOLE 500 MG PO TABS: 500.0000 mg | ORAL_TABLET | Freq: Three times a day (TID) | ORAL | Status: DC

## 2013-07-23 NOTE — Discharge Instructions (Signed)
Low-Fiber Diet °Fiber is found in fruits, vegetables, and grains. A low-fiber diet restricts fibrous foods that are not digested in the small intestine. A diet containing about 10 grams of fiber is considered low fiber.  °PURPOSE °· To prevent blockage of a partially obstructed or narrowed gastrointestinal tract. °· To reduce fecal weight and volume. °· To slow the movement of feces. °WHEN IS THIS DIET USED? °· It may be used during the acute phase of Crohn disease, ulcerative colitis, regional enteritis, or diverticulitis. °· It may be used if your intestinal or esophageal tubes are narrowing (stenosis). °· It may be used as a transitional diet following surgery, injury (trauma), or illness. °CHOOSING FOODS °Check labels, especially on foods from the starch list. Often times, dietary fiber content is listed on the nutrition facts panel. Please ask your Registered Dietitian if you have questions about specific foods that are related to your condition, especially if the food is not listed on this handout. °Breads and Starches °· Allowed: White, French, and pita breads, plain rolls, buns, or sweet rolls, doughnuts, waffles, pancakes, bagels. Plain muffins, biscuits, matzoth. Soda, saltine, graham crackers. Pretzels, rusks, melba toast, zwieback. Cooked cereals: cornmeal, farina, or cream cereals. Dry cereals: refined corn, wheat, rice, and oat cereals (check label). Potatoes prepared any way without skins, refined macaroni, spaghetti, noodles, refined rice. °· Avoid: Whole-wheat bread, rolls, and crackers. Multigrains, rye, bran seeds, nuts, or coconut. Cereals containing whole grains, multigrains, bran, coconut, nuts, raisins. Cooked or dry oatmeal. Coarse wheat cereals, granola. Cereals advertised as "high fiber." Potato skins. Whole-grain pasta, wild or brown rice. Popcorn. °Vegetables °· Allowed: Strained tomato and vegetable juices. Fresh lettuce, cucumber, spinach. Well-cooked or canned: asparagus, bean sprouts,  broccoli, cut green beans, cauliflower, pumpkin, beets, mushrooms, yellow squash, tomato, tomato sauce, zucchini, turnips. Keep servings limited to ½ cup. °· Avoid: Fresh, cooked, or canned: artichokes, baked beans, beet greens, Brussels sprouts, corn, kale, legumes, peas, sweet potatoes. Avoid large servings of any vegetables. °Fruit °· Allowed: All fruit juices except prune juice. Cooked or canned fruits without skin and seeds: apricots, applesauce, cantaloupe, cherries, grapefruit, grapes, kiwi, mandarin oranges, peaches, pears, fruit cocktail, pineapple, plums, watermelon. Fresh without skin: banana, grapes, cantaloupe, avocado, cherries, pineapple, kiwi, nectarines, peaches, blueberries. Keep servings limited to ½ cup or 1 piece. °· Avoid: Fresh: apples with or without skin, apricots, mangoes, pears, raspberries, strawberries. Prune juice and juices with pulp, stewed or dried prunes. Dried fruits, raisins, dates. Avoid large servings of all fresh fruits. °Meat and Protein Substitutes °· Allowed: Ground or well-cooked tender beef, ham, veal, lamb, pork, poultry. Eggs, plain cheese. Fish, oysters, shrimp, lobster, other seafood. Liver, organ meats. Smooth nut butters. °· Avoid: Tough, fibrous meats with gristle. Chunky nut butter. Cheese with seeds, nuts, or other foods not allowed. Nuts, seeds, legumes, dried peas, beans, lentils. °Dairy °· Allowed: All milk products except those not allowed. °· Avoid: Yogurt or cheese that contains nuts, seeds, or added fruit.  °Soups and Combination Foods °· Allowed: Bouillon, broth, or cream soups made from allowed foods. Any strained soup. Casseroles or mixed dishes made with allowed foods. °· Avoid: Soups made from vegetables that are not allowed or that contain other foods not allowed. °Desserts and Sweets °· Allowed: Plain cakes and cookies, pie made with allowed fruit, pudding, custard, cream pie. Gelatin, fruit, ice, sherbet, frozen ice pops. Ice cream, ice milk without  nuts. Plain hard candy, honey, jelly, molasses, syrup, sugar, chocolate syrup, gumdrops, marshmallows. °· Avoid: Desserts, cookies, or candies that contain   nuts, peanut butter, dried fruits. Jams, preserves with seeds, marmalade. Fats and Oils  Allowed:Margarine, butter, cream, mayonnaise, salad oils, plain salad dressings made from allowed foods.  Avoid: Seeds, nuts, olives. Beverages  Allowed: All, except those listed to avoid.  Avoid: Fruit juices with high pulp, prune juice. Condiments  Allowed:Ketchup, mustard, horseradish, vinegar, cream sauce, cheese sauce, cocoa powder. Spices in moderation: allspice, basil, bay leaves, celery powder or leaves, cinnamon, cumin powder, curry powder, ginger, mace, marjoram, onion or garlic powder, oregano, paprika, parsley flakes, ground pepper, rosemary, sage, savory, tarragon, thyme, turmeric.  Avoid: Coconut, pickles. SAMPLE MENU Breakfast   cup orange juice.  1 boiled egg.  1 slice white toast.  Margarine.   cup cornflakes.  1 cup milk.  Beverage. Lunch   cup chicken noodle soup.  2 to 3 oz sliced roast beef.  2 slices white bread.  Mayonnaise.   cup tomato juice.  1 small banana.  Beverage. Dinner  3 oz baked chicken.   cup scalloped potatoes.   cup cooked beets.  White dinner roll.  Margarine.   cup canned peaches.  Beverage. Document Released: 10/17/2001 Document Revised: 12/28/2012 Document Reviewed: 05/14/2011 [facility] Patient Information 2014 [place], [place].   Diverticulitis A diverticulum is a small pouch or sac on the colon. Diverticulosis is the presence of these diverticula on the colon. Diverticulitis is the irritation (inflammation) or infection of diverticula. CAUSES  The colon and its diverticula contain bacteria. If food particles block the tiny opening to a diverticulum, the bacteria inside can grow and cause an increase in pressure. This leads to infection and inflammation and  is called diverticulitis. SYMPTOMS   Abdominal pain and tenderness. Usually, the pain is located on the left side of your abdomen. However, it could be located elsewhere.  Fever.  Bloating.  Feeling sick to your stomach (nausea).  Throwing up (vomiting).  Abnormal stools. DIAGNOSIS  Your caregiver will take a history and perform a physical exam. Since many things can cause abdominal pain, other tests may be necessary. Tests may include:  Blood tests.  Urine tests.  X-ray of the abdomen.  CT scan of the abdomen. Sometimes, surgery is needed to determine if diverticulitis or other conditions are causing your symptoms. TREATMENT  Most of the time, you can be treated without surgery. Treatment includes:  Resting the bowels by only having liquids for a few days. As you improve, you will need to eat a low-fiber diet.  Intravenous (IV) fluids if you are losing body fluids (dehydrated).  Antibiotic medicines that treat infections may be given.  Pain and nausea medicine, if needed.  Surgery if the inflamed diverticulum has burst. HOME CARE INSTRUCTIONS   Try a clear liquid diet (broth, tea, or water for as long as directed by your caregiver). You may then gradually begin a low-fiber diet as tolerated.  A low-fiber diet is a diet with less than 10 grams of fiber. Choose the foods below to reduce fiber in the diet:  White breads, cereals, rice, and pasta.  Cooked fruits and vegetables or soft fresh fruits and vegetables without the skin.  Ground or well-cooked tender beef, ham, veal, lamb, pork, or poultry.  Eggs and seafood.  After your diverticulitis symptoms have improved, your caregiver may put you on a high-fiber diet. A high-fiber diet includes 14 grams of fiber for every 1000 calories consumed. For a standard 2000 calorie diet, you would need 28 grams of fiber. Follow these diet guidelines to help you increase the  fiber in your diet. It is important to slowly increase  the amount fiber in your diet to avoid gas, constipation, and bloating.  Choose whole-grain breads, cereals, pasta, and brown rice.  Choose fresh fruits and vegetables with the skin on. Do not overcook vegetables because the more vegetables are cooked, the more fiber is lost.  Choose more nuts, seeds, legumes, dried peas, beans, and lentils.  Look for food products that have greater than 3 grams of fiber per serving on the Nutrition Facts label.  Take all medicine as directed by your caregiver.  If your caregiver has given you a follow-up appointment, it is very important that you go. Not going could result in lasting (chronic) or permanent injury, pain, and disability. If there is any problem keeping the appointment, call to reschedule. SEEK MEDICAL CARE IF:   Your pain does not improve.  You have a hard time advancing your diet beyond clear liquids.  Your bowel movements do not return to normal. SEEK IMMEDIATE MEDICAL CARE IF:   Your pain becomes worse.  You have an oral temperature above 102 F (38.9 C), not controlled by medicine.  You have repeated vomiting.  You have bloody or black, tarry stools.  Symptoms that brought you to your caregiver become worse or are not getting better. MAKE SURE YOU:   Understand these instructions.  Will watch your condition.  Will get help right away if you are not doing well or get worse. Document Released: 02/04/2005 Document Revised: 07/20/2011 Document Reviewed: 06/02/2010 [facility] Patient Information 2014 [place]

## 2013-07-23 NOTE — Progress Notes (Signed)
Doing well. Ok for discharge from surgical standpoint.  Follow-up instructions entered.  Wilmon ArmsMatthew K. Corliss Skainssuei, MD, [facility]  General/ Trauma Surgery  07/23/2013 10:06 AM

## 2013-07-23 NOTE — Discharge Summary (Signed)
Physician Discharge Summary  Virginia Becker ZOX:[id number] DOB: 1946-03-29 DOA: 07/18/2013  PCP: Emeterio Reeve, MD  Admit date: 07/18/2013 Discharge date: 07/23/2013  Time spent: 65 minutes  Recommendations for Outpatient Follow-up:  1. Followup with Emeterio Reeve, MD in 1 week. On followup CBC and a basic metabolic profile need to be obtained to followup on patient's electrolytes and renal function. Patient may need a referral to gastroenterologist for colonoscopy to be done. 2. Patient is to followup with Dr. Magnus Ivan of Gen. surgery 2-3 weeks post discharge.  Discharge Diagnoses:  Principal Problem:   Diverticulitis of intestine with abscess without bleeding Active Problems:   Hypothyroid   Leukocytosis   UTI (urinary tract infection)   Abdominal pain   Discharge Condition: Stable and improved  Diet recommendation: Low-residue diet  Filed Weights   07/18/13 1517  Weight: 78.744 kg (173 lb 9.6 oz)    History of present illness:  Virginia Becker is a 68 y.o. female in good health  Who was sent from PCP with c/o LLQ pain- going on for 10 days. Saturday, she developed a fever. She was seen by her PCP on Monday who diagnosised her with diverticulitis and was given cipro/flagyl. Her blood count showed a WBC count. Her PCP got a CT scan at that point which showed :Extensive wall thickening is noted in the sigmoid colon. Findings are most consistent with severe diverticulitis. Colonic malignancy cannot be excluded. Focal associated area of necrosis and/or developing small abscess noted. No evidence of colonic obstruction over the sigmoid colon is severely narrowed by the above-described process. Left iliac and retroperitoneal lymphadenopathy  No blood in stool but does have diarrhea. No vomiting- asking to eat  Patient had a colonoscopy about 5 years ago with [facility], she thinks she is due for another soon. In her records, I found a colonscopy in 2004 by Dr. Loreta Ave:    [facility]  Course:  #1 acute severe diverticulitis with concern to necrosis and abscess formation  Patient was admitted with left lower quadrant pain fever and leukocytosis. Patient initially been placed on oral antibiotics for diverticulitis. CT scan was obtained by patient's PCP which showed extensive wall thickening in the sigmoid colons consistent with severe diverticulitis as well as a focal area of associated necrosis and/or developing small abscess was noted. Patient was admitted and placed empirically on IV ciprofloxacin and IV Flagyl. General surgical consultation was obtained and patient was seen in consultation by Dr. Magnus Ivan. Patient was followed throughout the hospitalization by the general surgical team. Patient was initially n.p.o. subsequently advanced to a clear liquid diet which he tolerated. And the first one to 2 days patient's to spike fevers however after the first 2 days of hospitalization patient's fever resolved did not have any further fevers. Patient improved clinically abdominal pain improved nausea resolved diet was advanced which patient tolerated and patient was tolerating a low-residue diet by day of discharge. Patient be discharged home on oral ciprofloxacin and oral Flagyl for 6 more days to complete a ten-day course of antibiotic therapy. Patient will followup with Dr. Magnus Ivan in general surgery 2-3 weeks. Patient was to followup with PCP as outpatient. Patient will need to also followup with GI for probable colonoscopy 4-6 weeks post her acute illness of diverticulitis with possible abscess formation. Patient was discharged in stable and improved condition.  #2 hypothyroidism  Continued on home regimen of Synthroid.  #3 leukocytosis  Likely secondary to problem #1. Patient was noted to have a leukocytosis on admission.  It was felt this was likely secondary to problem #1. Blood cultures were drawn however with no growth to date. Urine cultures were negative. Patient was started  empirically on IV ciprofloxacin and Flagyl with resolution of her leukocytosis. Patient will followup with PCP as outpatient.  #4 probable UTI/  Patient on admission was started empirically on IV Cipro and Flagyl. Urinalysis was obtained once 2 days of antibiotics and started and were consistent with a UTI. Urine cultures were negative. Patient was on IV Cipro improved clinically and subsequently would have been treated adequately for urinary tract infection.  #5 depression  Continued on Celexa.   Procedures: CT abdomen and pelvis 07/18/2013   Consultations: General surgery: Dr. Magnus Ivan 07/18/2013   Discharge Exam: Filed Vitals:   07/23/13 0600  BP: 147/89  Pulse: 75  Temp: 97.9 F (36.6 C)  Resp: 18    General: NAD Cardiovascular: RRR Respiratory: CTAB  Discharge Instructions  Discharge Orders   Future Orders Complete By Expires   Diet general  As directed    Scheduling Instructions:     Low fiber diet.   Discharge instructions  As directed    Comments:     Follow up with Emeterio Reeve, MD in 1 week. Follow up with Dr Magnus Ivan in 2-3 weeks. Low fiber diet.   Increase activity slowly  As directed        Medication List         atorvastatin 40 MG tablet  Commonly known as:  LIPITOR  Take 40 mg by mouth daily.     cholecalciferol 1000 UNITS tablet  Commonly known as:  VITAMIN D  Take 1,000 Units by mouth daily.     ciprofloxacin 500 MG tablet  Commonly known as:  CIPRO  Take 1 tablet (500 mg total) by mouth 2 (two) times daily. For 6 days.     citalopram 20 MG tablet  Commonly known as:  CELEXA  Take 20 mg by mouth daily.     levothyroxine 100 MCG tablet  Commonly known as:  SYNTHROID, LEVOTHROID  Take 100 mcg by mouth daily.     metroNIDAZOLE 500 MG tablet  Commonly known as:  FLAGYL  Take 1 tablet (500 mg total) by mouth 3 (three) times daily. For 6 days.     zolpidem 10 MG tablet  Commonly known as:  AMBIEN  Take 10 mg by mouth at bedtime  as needed.       No Known Allergies     Follow-up Information   Follow up with [facility], MD. Schedule an appointment as soon as possible for a visit in 2 weeks. (For post-hospital follow up with Dr. Magnus Ivan regarding your diverticulitis)    Specialty:  General Surgery   Contact information:   [address] [place] [address] [phone]       Follow up with Emeterio Reeve, MD. Schedule an appointment as soon as possible for a visit in 1 week.   Specialty:  Family Medicine   Contact information:   [address] [place] [address] [phone]        The results of significant diagnostics from this hospitalization (including imaging, microbiology, ancillary and laboratory) are listed below for reference.    Significant Diagnostic Studies: Ct Abdomen Pelvis W Contrast  07/18/2013   CLINICAL DATA:  Left lower quadrant pain.  Fever and diarrhea.  EXAM: CT ABDOMEN AND PELVIS WITH CONTRAST  TECHNIQUE: Multidetector CT imaging of the abdomen  and pelvis was performed using the standard protocol following bolus administration of intravenous contrast.  CONTRAST:  100 cc Omnipaque 300.  COMPARISON:  None.  FINDINGS: Innumerable hepatic lucencies are noted consistent with cysts. Spleen is normal. Pancreas normal. No biliary distention. Gallbladder is nondistended. Portal and splenic veins patent.  Adrenals normal. Kidneys are normal. No obstructing ureteral stone. Bladder is nondistended.  Left iliac and retroperitoneal lymphadenopathy cannot be excluded. Largest lymph node measures 1 cm. and is in the periaortic region.  No inflammatory changes are noted in the right lower quadrant. Appendix appears normal. Extensive wall thickening is noted in the sigmoid colon with adjacent region of necrosis and/or abscess. Differential diagnosis includes severe changes of diverticulitis and/or focal malignancy. Colitis would be less likely. Scattered  diverticuli are present. There is no evidence of bowel obstruction. No free air.  Mild atelectasis lung bases. Heart size normal. Abdominal wall intact. Degenerative changes lumbar spine  IMPRESSION: 1. Extensive wall thickening is noted in the sigmoid colon. Findings are most consistent with severe diverticulitis. Colonic malignancy cannot be excluded. Focal associated area of necrosis and/or developing small abscess noted. No evidence of colonic obstruction over the sigmoid colon is severely narrowed by the above-described process.  2.  Left iliac and retroperitoneal lymphadenopathy.   Electronically Signed   By: Maisie Fus  Register   On: 07/18/2013 13:13    Microbiology: Recent Results (from the past 240 hour(s))  CULTURE, BLOOD (ROUTINE X 2)     Status: None   Collection Time    07/18/13  4:05 PM      Result Value Ref Range Status   Specimen Description BLOOD LEFT FOREARM   Final   Special Requests BOTTLES DRAWN AEROBIC AND ANAEROBIC 5CC   Final   Culture  Setup Time     Final   Value: 07/18/2013 23:01     Performed at [facility]   Culture     Final   Value:        BLOOD CULTURE RECEIVED NO GROWTH TO DATE CULTURE WILL BE HELD FOR 5 DAYS BEFORE ISSUING A FINAL NEGATIVE REPORT     Performed at [facility]   Report Status PENDING   Incomplete  CULTURE, BLOOD (ROUTINE X 2)     Status: None   Collection Time    07/18/13  4:17 PM      Result Value Ref Range Status   Specimen Description BLOOD LEFT ARM   Final   Special Requests BOTTLES DRAWN AEROBIC AND ANAEROBIC 5 CC   Final   Culture  Setup Time     Final   Value: 07/18/2013 20:00     Performed at [facility]   Culture     Final   Value:        BLOOD CULTURE RECEIVED NO GROWTH TO DATE CULTURE WILL BE HELD FOR 5 DAYS BEFORE ISSUING A FINAL NEGATIVE REPORT     Performed at [facility]   Report Status PENDING   Incomplete  URINE CULTURE     Status: None   Collection Time    07/20/13 12:00 PM       Result Value Ref Range Status   Specimen Description URINE, CLEAN CATCH   Final   Special Requests NONE   Final   Culture  Setup Time     Final   Value: 07/20/2013 12:47     Performed at [facility] Count     Final  Value: NO GROWTH     [facility] at [facility]   Culture     Final   Value: NO GROWTH     [facility]formed at [facility]   Report Status 07/21/2013 FINAL   Final     Labs: Basic Metabolic Panel:  Recent Labs Lab 07/19/13 0653 07/20/13 0507 07/21/13 0513 07/22/13 0535 07/23/13 0405  NA 139 139 140 142 141  K 3.9 4.0 4.5 3.9 3.8  CL 101 100 100 101 99  CO2 25 26 26 26 29   GLUCOSE 115* 97 81 97 110*  BUN 10 10 8 7  5*  CREATININE 0.77 0.77 0.74 0.68 0.73  CALCIUM 8.7 9.0 8.9 8.8 9.2   Liver Function Tests:  Recent Labs Lab 07/18/13 1523  AST 26  ALT 31  ALKPHOS 65  BILITOT 0.6  PROT 7.4  ALBUMIN 3.7    Recent Labs Lab 07/18/13 1523  LIPASE 9*   No results found for this basename: AMMONIA,  in the last 168 hours CBC:  Recent Labs Lab 07/18/13 1523 07/19/13 0653 07/20/13 0507 07/21/13 0513 07/22/13 0535  WBC 11.0* 8.8 10.0 5.2 3.9*  NEUTROABS 8.2*  --   --   --   --   HGB 13.7 12.2 12.2 12.1 11.8*  HCT 40.7 37.7 37.4 37.0 35.4*  MCV 82.4 82.7 82.9 82.6 81.9  PLT 228 221 207 204 208   Cardiac Enzymes: No results found for this basename: CKTOTAL, CKMB, CKMBINDEX, TROPONINI,  in the last 168 hours BNP: BNP (last 3 results) No results found for this basename: PROBNP,  in the last 8760 hours CBG: No results found for this basename: GLUCAP,  in the last 168 hours     Signed:  [facility],Aamari West MD [facility] 07/23/2013, 2:15 PM

## 2013-07-23 NOTE — Progress Notes (Signed)
  Subjective: Doing well tolerating diet.  Dietitian talked about low fiber diet.  Having BM's and urinating well.  No N/V.  Abdominal pain resolved.  Ambulating well.    Objective: Vital signs in last 24 hours: Temp:  [97.8 F (36.6 C)-98.2 F (36.8 C)] 97.9 F (36.6 C) (03/15 0600) Pulse Rate:  [72-94] 75 (03/15 0600) Resp:  [18] 18 (03/15 0600) BP: (135-147)/(77-89) 147/89 mmHg (03/15 0600) SpO2:  [96 %-98 %] 98 % (03/15 0600) Last BM Date: 07/22/13  Intake/Output from previous day:   Intake/Output this shift:    PE: Gen:  Alert, NAD, pleasant Abd: Soft, NT/ND, +BS, no HSM   Lab Results:   Recent Labs  07/21/13 0513 07/22/13 0535  WBC 5.2 3.9*  HGB 12.1 11.8*  HCT 37.0 35.4*  PLT 204 208   BMET  Recent Labs  07/22/13 0535 07/23/13 0405  NA 142 141  K 3.9 3.8  CL 101 99  CO2 26 29  GLUCOSE 97 110*  BUN 7 5*  CREATININE 0.68 0.73  CALCIUM 8.8 9.2   PT/INR No results found for this basename: LABPROT, INR,  in the last 72 hours CMP     Component Value Date/Time   NA 141 07/23/2013 0405   K 3.8 07/23/2013 0405   CL 99 07/23/2013 0405   CO2 29 07/23/2013 0405   GLUCOSE 110* 07/23/2013 0405   BUN 5* 07/23/2013 0405   CREATININE 0.73 07/23/2013 0405   CALCIUM 9.2 07/23/2013 0405   PROT 7.4 07/18/2013 1523   ALBUMIN 3.7 07/18/2013 1523   AST 26 07/18/2013 1523   ALT 31 07/18/2013 1523   ALKPHOS 65 07/18/2013 1523   BILITOT 0.6 07/18/2013 1523   GFRNONAA 86* 07/23/2013 0405   GFRAA >90 07/23/2013 0405   Lipase     Component Value Date/Time   LIPASE 9* 07/18/2013 1523       Studies/Results: No results found.  Anti-infectives: Anti-infectives   Start     Dose/Rate Route Frequency Ordered Stop   07/22/13 1900  ciprofloxacin (CIPRO) tablet 500 mg     500 mg Oral 2 times daily 07/22/13 1145     07/22/13 1400  metroNIDAZOLE (FLAGYL) tablet 500 mg     500 mg Oral 3 times per day 07/22/13 1145     07/19/13 0600  ciprofloxacin (CIPRO) IVPB 400 mg  Status:   Discontinued     400 mg 200 mL/hr over 60 Minutes Intravenous Every 12 hours 07/18/13 2014 07/22/13 1145   07/18/13 2200  metroNIDAZOLE (FLAGYL) IVPB 500 mg  Status:  Discontinued     500 mg 100 mL/hr over 60 Minutes Intravenous Every 8 hours 07/18/13 2014 07/22/13 1145   07/18/13 1600  ciprofloxacin (CIPRO) IVPB 400 mg     400 mg 200 mL/hr over 60 Minutes Intravenous  Once 07/18/13 1557 07/18/13 1938   07/18/13 1600  metroNIDAZOLE (FLAGYL) IVPB 500 mg     500 mg 100 mL/hr over 60 Minutes Intravenous  Once 07/18/13 1557 07/18/13 1740       Assessment/Plan 1. Diverticulitis   Plan:  1.  Tolerating soft diet, encourage low residue diet at discharge 2.  Okay for discharge from surgical perspective on oral Cipro/flagyl for 7-10 days, recommended probiotic vs yogurt 3.  Follow up with Dr. Magnus IvanBlackman in 2-3 weeks, discussed considerations and indications for surgical intervention vs watchful waiting.      LOS: 5 days    DORT, Aundra MilletMEGAN 07/23/2013, 8:06 AM Pager: [phone]

## 2013-07-24 LAB — CULTURE, BLOOD (ROUTINE X 2)
Culture: NO GROWTH
Culture: NO GROWTH

## 2013-08-07 ENCOUNTER — Ambulatory Visit (INDEPENDENT_AMBULATORY_CARE_PROVIDER_SITE_OTHER): Payer: Medicare Other | Admitting: Surgery

## 2013-08-07 ENCOUNTER — Other Ambulatory Visit (INDEPENDENT_AMBULATORY_CARE_PROVIDER_SITE_OTHER): Payer: Self-pay | Admitting: Surgery

## 2013-08-07 VITALS — BP 136/80 | HR 77 | Temp 97.4°F | Ht 68.0 in | Wt 169.8 lb

## 2013-08-07 DIAGNOSIS — K5732 Diverticulitis of large intestine without perforation or abscess without bleeding: Secondary | ICD-10-CM

## 2013-08-07 DIAGNOSIS — Z9889 Other specified postprocedural states: Secondary | ICD-10-CM

## 2013-08-07 NOTE — Progress Notes (Signed)
Subjective:     Patient ID: Virginia Becker, female   DOB: 08/28/1945, 68 y.o.   MRN: [id number]  HPI She is here for a Hospital followup. She was admitted with significant diverticulitis. She resolved quickly in the hospital and has finished her oral antibiotics. She now has no pain in spinoff antibiotics for a week. She denies fevers and is moving her bowels well.  Review of Systems     Objective:   Physical Exam On exam, she looks great. Her abdomen is soft and nontender Without any guarding     Assessment:     Patient with resolved diverticulitis     Plan:     She has not had a colonoscopy in over 5 years. She believes it was Dr. Loreta AveMann who did her previous colonoscopy. Given that the CAT scan could not rule out malignancy, I believe she does need a followup colonoscopy. We discussed diverticulitis and whether or not she should have resection. She resolved this episode so quickly I will evaluate to see if she has another episode prior to the resection. I did discuss risks of bowel perforation and colostomy with episodes of diverticulitis. Should she develop any abdominal discomfort she'll call us back as soon as possible and we will resume her antibiotics and see her again. If not, we will see her as needed

## 2013-08-16 ENCOUNTER — Other Ambulatory Visit (HOSPITAL_COMMUNITY): Payer: Self-pay | Admitting: Family Medicine

## 2013-08-16 ENCOUNTER — Other Ambulatory Visit: Payer: Self-pay | Admitting: Physical Therapy

## 2013-08-16 DIAGNOSIS — Z1231 Encounter for screening mammogram for malignant neoplasm of breast: Secondary | ICD-10-CM

## 2013-08-22 ENCOUNTER — Ambulatory Visit (HOSPITAL_COMMUNITY)
Admission: RE | Admit: 2013-08-22 | Discharge: 2013-08-22 | Disposition: A | Discharge status: Home / Self Care | Payer: Medicare Other | Source: Ambulatory Visit | Attending: Family Medicine | Admitting: Family Medicine

## 2013-08-22 DIAGNOSIS — Z1231 Encounter for screening mammogram for malignant neoplasm of breast: Secondary | ICD-10-CM | POA: Insufficient documentation

## 2014-05-21 ENCOUNTER — Other Ambulatory Visit: Payer: Self-pay | Admitting: Family Medicine

## 2014-05-21 ENCOUNTER — Ambulatory Visit
Admission: RE | Admit: 2014-05-21 | Discharge: 2014-05-21 | Disposition: A | Discharge status: Home / Self Care | Payer: Medicare Other | Source: Ambulatory Visit | Attending: Family Medicine | Admitting: Family Medicine

## 2014-05-21 DIAGNOSIS — R05 Cough: Secondary | ICD-10-CM

## 2014-05-21 DIAGNOSIS — R059 Cough, unspecified: Secondary | ICD-10-CM

## 2014-11-05 ENCOUNTER — Other Ambulatory Visit: Payer: Medicare Other

## 2014-11-05 ENCOUNTER — Other Ambulatory Visit: Payer: Self-pay | Admitting: Family Medicine

## 2014-11-05 DIAGNOSIS — R1032 Left lower quadrant pain: Secondary | ICD-10-CM

## 2014-11-06 ENCOUNTER — Ambulatory Visit
Admission: RE | Admit: 2014-11-06 | Discharge: 2014-11-06 | Disposition: A | Discharge status: Home / Self Care | Payer: Medicare Other | Source: Ambulatory Visit | Attending: Family Medicine | Admitting: Family Medicine

## 2014-11-06 DIAGNOSIS — R1032 Left lower quadrant pain: Secondary | ICD-10-CM

## 2014-11-06 MED ORDER — IOPAMIDOL (ISOVUE-300) INJECTION 61%
100.0000 mL | Freq: Once | INTRAVENOUS | Status: AC | PRN
  Administered 2014-11-06: 100 mL via INTRAVENOUS

## 2014-11-28 ENCOUNTER — Emergency Department (HOSPITAL_COMMUNITY): Payer: Medicare Other

## 2014-11-28 ENCOUNTER — Encounter (HOSPITAL_COMMUNITY): Payer: Self-pay | Admitting: Emergency Medicine

## 2014-11-28 ENCOUNTER — Emergency Department (HOSPITAL_COMMUNITY)
Admission: EM | Admit: 2014-11-28 | Discharge: 2014-11-28 | Disposition: A | Discharge status: Home / Self Care | Payer: Medicare Other | Attending: Emergency Medicine | Admitting: Emergency Medicine

## 2014-11-28 DIAGNOSIS — S80211A Abrasion, right knee, initial encounter: Secondary | ICD-10-CM | POA: Diagnosis not present

## 2014-11-28 DIAGNOSIS — S80212A Abrasion, left knee, initial encounter: Secondary | ICD-10-CM | POA: Insufficient documentation

## 2014-11-28 DIAGNOSIS — S0993XA Unspecified injury of face, initial encounter: Secondary | ICD-10-CM | POA: Diagnosis present

## 2014-11-28 DIAGNOSIS — S40211A Abrasion of right shoulder, initial encounter: Secondary | ICD-10-CM | POA: Insufficient documentation

## 2014-11-28 DIAGNOSIS — S0181XA Laceration without foreign body of other part of head, initial encounter: Secondary | ICD-10-CM | POA: Insufficient documentation

## 2014-11-28 DIAGNOSIS — S60415A Abrasion of left ring finger, initial encounter: Secondary | ICD-10-CM | POA: Diagnosis not present

## 2014-11-28 DIAGNOSIS — Y9389 Activity, other specified: Secondary | ICD-10-CM | POA: Insufficient documentation

## 2014-11-28 DIAGNOSIS — Y998 Other external cause status: Secondary | ICD-10-CM | POA: Insufficient documentation

## 2014-11-28 DIAGNOSIS — E079 Disorder of thyroid, unspecified: Secondary | ICD-10-CM | POA: Diagnosis not present

## 2014-11-28 DIAGNOSIS — I10 Essential (primary) hypertension: Secondary | ICD-10-CM | POA: Insufficient documentation

## 2014-11-28 DIAGNOSIS — E785 Hyperlipidemia, unspecified: Secondary | ICD-10-CM | POA: Insufficient documentation

## 2014-11-28 DIAGNOSIS — Z79899 Other long term (current) drug therapy: Secondary | ICD-10-CM | POA: Insufficient documentation

## 2014-11-28 DIAGNOSIS — W01198A Fall on same level from slipping, tripping and stumbling with subsequent striking against other object, initial encounter: Secondary | ICD-10-CM | POA: Diagnosis not present

## 2014-11-28 DIAGNOSIS — F329 Major depressive disorder, single episode, unspecified: Secondary | ICD-10-CM | POA: Insufficient documentation

## 2014-11-28 DIAGNOSIS — W19XXXA Unspecified fall, initial encounter: Secondary | ICD-10-CM

## 2014-11-28 DIAGNOSIS — S20211A Contusion of right front wall of thorax, initial encounter: Secondary | ICD-10-CM | POA: Diagnosis not present

## 2014-11-28 DIAGNOSIS — S60417A Abrasion of left little finger, initial encounter: Secondary | ICD-10-CM | POA: Insufficient documentation

## 2014-11-28 DIAGNOSIS — Y92480 Sidewalk as the place of occurrence of the external cause: Secondary | ICD-10-CM | POA: Diagnosis not present

## 2014-11-28 DIAGNOSIS — T07XXXA Unspecified multiple injuries, initial encounter: Secondary | ICD-10-CM

## 2014-11-28 HISTORY — DX: Essential (primary) hypertension: I10

## 2014-11-28 HISTORY — DX: Hyperlipidemia, unspecified: E78.5

## 2014-11-28 NOTE — ED Provider Notes (Signed)
CSN: [id number]     Arrival date & time 11/28/14  1225 History  This chart was scribed for Mancel Bale, MD by Elveria Rising, ED scribe.  This patient was seen in room WTR7/WTR7 and the patient's care was started at 12:51 PM.   Chief Complaint  Patient presents with  . Fall    denies LOC  . Laceration    1cm shallow laceration  . Knee Pain    knee pain/abrasions  . Shoulder Injury    abrasion r/shoulder   The history is provided by the patient. No language interpreter was used.   HPI Comments: Virginia Becker is a 69 y.o. female with PMHx of Hypertension, thyroid disease, hyperlipidemia, and depression who presents to the Emergency Department with several injuries sustained during a mechanical fall this afternoon; patient reports stumbling and falling on uneven sidewalk. Patient struck her head on landing, but denies loss of consciousness. Patient complains of abrasions to right shoulder, bilateral knee pain and hand pain, right lower rib pain, and small laceration to right eyebrow. Patient reports pain to right lower ribs with twisting and deep breathing, but denies pain at rest. Patient is uncertain of the date of her last Tetanus, but reports having an annual physical with PCP in April of this year. Patient denies headache, nausea, vomiting, or dizziness.   Past Medical History  Diagnosis Date  . Thyroid disease   . Depression   . Hypertension   . Hyperlipidemia    Past Surgical History  Procedure Laterality Date  . Bone spur    . Corneal transplant    . Eye surgery    . Abdominal hysterectomy    . Thyroid surgery    . Tonsillectomy     Family History  Problem Relation Age of Onset  . Cancer Mother     pancreatic  . Mental illness Mother   . Heart disease Father   . Mental illness Father   . Hypertension Sister   . Mental illness Sister   . Hypertension Brother   . Breast cancer Maternal Aunt   . Breast cancer Maternal Aunt    History  Substance Use Topics  .  Smoking status: Never Smoker   . Smokeless tobacco: Never Used  . Alcohol Use: No   OB History    No data available     Review of Systems  All other systems reviewed and are negative.   Allergies  Review of patient's allergies indicates no known allergies.  Home Medications   Prior to Admission medications   Medication Sig Start Date End Date Taking? Authorizing Provider  atorvastatin (LIPITOR) 40 MG tablet Take 40 mg by mouth daily.    Historical Provider, MD  cholecalciferol (VITAMIN D) 1000 UNITS tablet Take 1,000 Units by mouth daily.    Historical Provider, MD  citalopram (CELEXA) 20 MG tablet Take 20 mg by mouth daily.    Historical Provider, MD  levothyroxine (SYNTHROID, LEVOTHROID) 100 MCG tablet Take 100 mcg by mouth daily.    Historical Provider, MD  metroNIDAZOLE (FLAGYL) 500 MG tablet Take 1 tablet (500 mg total) by mouth 3 (three) times daily. For 6 days. 07/23/13   Rodolph Bong, MD  nystatin (MYCOSTATIN) 100000 UNIT/ML suspension  07/31/13   Historical Provider, MD  zolpidem (AMBIEN) 10 MG tablet Take 10 mg by mouth at bedtime as needed.    Historical Provider, MD   Triage Vitals: BP 161/94 mmHg  Pulse 84  Temp(Src) 98.4 F (36.9 C) (Oral)  Resp 18  SpO2 100% Physical Exam  Constitutional: She is oriented to person, place, and time. She appears well-developed and well-nourished.  HENT:  Head: Normocephalic and atraumatic.  2cm, nongaping superficial laceration of the right eyebrow.   Eyes: Conjunctivae and EOM are normal. Pupils are equal, round, and reactive to light.  Neck: Normal range of motion and phonation normal. Neck supple.  Cardiovascular: Normal rate and regular rhythm.   Pulmonary/Chest: Effort normal and breath sounds normal. She exhibits no tenderness.  Abdominal: Soft. She exhibits no distension. There is no tenderness. There is no guarding.  Musculoskeletal: She exhibits tenderness.  Neurological: She is alert and oriented to person, place,  and time. She exhibits normal muscle tone.  Skin: Skin is warm and dry.  Abrasions to right shoulder, 4th and 5th left fingers, both knees, and right palm.   Psychiatric: She has a normal mood and affect. Her behavior is normal. Judgment and thought content normal.  Nursing note and vitals reviewed.   ED Course  Procedures (including critical care time)  LACERATION REPAIR Performed by: Gray BernhardtElliot Siennah Barrasso, MD Consent: Verbal consent obtained. Risks and benefits: risks, benefits and alternatives were discussed Patient identity confirmed: provided demographic data Time out performed prior to procedure Prepped and Draped in normal sterile fashion Wound explored Laceration Location: right eyebrow Laceration Length: 2cm No Foreign Bodies seen or palpated Anesthesia: local infiltration Amount of cleaning: standard Skin closure: Dermabond  Patient tolerance: Patient tolerated the procedure well with no immediate complications.   COORDINATION OF CARE: 12:59 PM- Patient agrees to imaging of her ribs. Discussed treatment plan with patient at bedside and patient agreed to plan.   2:15 PM Reevaluation with update and discussion. After initial assessment and treatment, an updated evaluation reveals No additional complaints. Findings discussed with patient, all questions answered. Denaisha Swango L   Medications - No data to display  Patient Vitals for the past 24 hrs:  BP Temp Temp src Pulse Resp SpO2  11/28/14 1230 161/94 mmHg 98.4 F (36.9 C) Oral 84 18 100 %    Labs Review Labs Reviewed - No data to display  Imaging Review No results found.   EKG Interpretation None      MDM   Final diagnoses:  Fall, initial encounter  Laceration of face, initial encounter  Contusion, chest wall, right, initial encounter  Abrasion, multiple sites    Apparent mechanical fall, without serious injury. Doubt intracranial bleeding, spine injury, serious bacterial infection or metabolic  instability.  Nursing Notes Reviewed/ Care Coordinated Applicable Imaging Reviewed Interpretation of Laboratory Data incorporated into ED treatment  The patient appears reasonably screened and/or stabilized for discharge and I doubt any other medical condition or other [facility] requiring further screening, evaluation, or treatment in the ED at this time prior to discharge.  Plan: Home Medications- OTC analgesia; Home Treatments- rest, wound care; return here if the recommended treatment, does not improve the symptoms; Recommended follow up- PCP prn, and to discuss Tetanus status within 3 days   I personally performed the services described in this documentation, which was scribed in my presence. The recorded information has been reviewed and is accurate.      Mancel BaleElliott Shivangi Lutz, MD 11/28/14 [phone]

## 2014-11-28 NOTE — ED Notes (Signed)
Pt reports that she tripped, stumbled and fall on protruding concrete slab. Pt denies LOC. Abrasions on r/shouldert, both knees and hands. C/o pain under r/rib. 1 cm  laceration noted over r/eyebrow

## 2014-11-28 NOTE — Discharge Instructions (Signed)
Abrasion An abrasion is a cut or scrape of the skin. Abrasions do not extend through all layers of the skin and most heal within 10 days. It is important to care for your abrasion properly to prevent infection. CAUSES  Most abrasions are caused by falling on, or gliding across, the ground or other surface. When your skin rubs on something, the outer and inner layer of skin rubs off, causing an abrasion. DIAGNOSIS  Your caregiver will be able to diagnose an abrasion during a physical exam.  TREATMENT  Your treatment depends on how large and deep the abrasion is. Generally, your abrasion will be cleaned with water and a mild soap to remove any dirt or debris. An antibiotic ointment may be put over the abrasion to prevent an infection. A bandage (dressing) may be wrapped around the abrasion to keep it from getting dirty.  You may need a tetanus shot if:  You cannot remember when you had your last tetanus shot.  You have never had a tetanus shot.  The injury broke your skin. If you get a tetanus shot, your arm may swell, get red, and feel warm to the touch. This is common and not a problem. If you need a tetanus shot and you choose not to have one, there is a rare chance of getting tetanus. Sickness from tetanus can be serious.  HOME CARE INSTRUCTIONS   If a dressing was applied, change it at least once a day or as directed by your caregiver. If the bandage sticks, soak it off with warm water.   Wash the area with water and a mild soap to remove all the ointment 2 times a day. Rinse off the soap and pat the area dry with a clean towel.   Reapply any ointment as directed by your caregiver. This will help prevent infection and keep the bandage from sticking. Use gauze over the wound and under the dressing to help keep the bandage from sticking.   Change your dressing right away if it becomes wet or dirty.   Only take over-the-counter or prescription medicines for pain, discomfort, or fever as  directed by your caregiver.   Follow up with your caregiver within 24-48 hours for a wound check, or as directed. If you were not given a wound-check appointment, look closely at your abrasion for redness, swelling, or pus. These are signs of infection. SEEK IMMEDIATE MEDICAL CARE IF:   You have increasing pain in the wound.   You have redness, swelling, or tenderness around the wound.   You have pus coming from the wound.   You have a fever or persistent symptoms for more than 2-3 days.  You have a fever and your symptoms suddenly get worse.  You have a bad smell coming from the wound or dressing.  MAKE SURE YOU:   Understand these instructions.  Will watch your condition.  Will get help right away if you are not doing well or get worse. Document Released: 02/04/2005 Document Revised: 04/13/2012 Document Reviewed: 03/31/2011 [facility] Patient Information 2015 [place], [place]. This information is not intended to replace advice given to you by your health care provider. Make sure you discuss any questions you have with your health care provider.  Contusion A contusion is a deep bruise. Contusions are the result of an injury that caused bleeding under the skin. The contusion may turn blue, purple, or yellow. Minor injuries will give you a painless contusion, but more severe contusions may stay painful and swollen  for a few weeks.  CAUSES  A contusion is usually caused by a blow, trauma, or direct force to an area of the body. SYMPTOMS   Swelling and redness of the injured area.  Bruising of the injured area.  Tenderness and soreness of the injured area.  Pain. DIAGNOSIS  The diagnosis can be made by taking a history and physical exam. An X-ray, CT scan, or MRI may be needed to determine if there were any associated injuries, such as fractures. TREATMENT  Specific treatment will depend on what area of the body was injured. In general, the best treatment for a contusion is  resting, icing, elevating, and applying cold compresses to the injured area. Over-the-counter medicines may also be recommended for pain control. Ask your caregiver what the best treatment is for your contusion. HOME CARE INSTRUCTIONS   Put ice on the injured area.  Put ice in a plastic bag.  Place a towel between your skin and the bag.  Leave the ice on for 15-20 minutes, 3-4 times a day, or as directed by your health care provider.  Only take over-the-counter or prescription medicines for pain, discomfort, or fever as directed by your caregiver. Your caregiver may recommend avoiding anti-inflammatory medicines (aspirin, ibuprofen, and naproxen) for 48 hours because these medicines may increase bruising.  Rest the injured area.  If possible, elevate the injured area to reduce swelling. SEEK IMMEDIATE MEDICAL CARE IF:   You have increased bruising or swelling.  You have pain that is getting worse.  Your swelling or pain is not relieved with medicines. MAKE SURE YOU:   Understand these instructions.  Will watch your condition.  Will get help right away if you are not doing well or get worse. Document Released: 02/04/2005 Document Revised: 05/02/2013 Document Reviewed: 03/02/2011 [facility] Patient Information 2015 [place]. This information is not intended to replace advice given to you by your health care provider. Make sure you discuss any questions you have with your health care provider.  Facial Laceration  A facial laceration is a cut on the face. These injuries can be painful and cause bleeding. Lacerations usually heal quickly, but they need special care to reduce scarring. DIAGNOSIS  Your health care provider will take a medical history, ask for details about how the injury occurred, and examine the wound to determine how deep the cut is. TREATMENT  Some facial lacerations may not require closure. Others may not be able to be closed because of an increased risk of  infection. The risk of infection and the chance for successful closure will depend on various factors, including the amount of time since the injury occurred. The wound may be cleaned to help prevent infection. If closure is appropriate, pain medicines may be given if needed. Your health care provider will use stitches (sutures), wound glue (adhesive), or skin adhesive strips to repair the laceration. These tools bring the skin edges together to allow for faster healing and a better cosmetic outcome. If needed, you may also be given a tetanus shot. HOME CARE INSTRUCTIONS  Only take over-the-counter or prescription medicines as directed by your health care provider.  Follow your health care provider's instructions for wound care. These instructions will vary depending on the technique used for closing the wound. For Sutures:  Keep the wound clean and dry.   If you were given a bandage (dressing), you should change it at least once a day. Also change the dressing if it becomes wet or dirty, or as  directed by your health care provider.   Wash the wound with soap and water 2 times a day. Rinse the wound off with water to remove all soap. Pat the wound dry with a clean towel.   After cleaning, apply a thin layer of the antibiotic ointment recommended by your health care provider. This will help prevent infection and keep the dressing from sticking.   You may shower as usual after the first 24 hours. Do not soak the wound in water until the sutures are removed.   Get your sutures removed as directed by your health care provider. With facial lacerations, sutures should usually be taken out after 4-5 days to avoid stitch marks.   Wait a few days after your sutures are removed before applying any makeup. For Skin Adhesive Strips:  Keep the wound clean and dry.   Do not get the skin adhesive strips wet. You may bathe carefully, using caution to keep the wound dry.   If the wound gets wet,  pat it dry with a clean towel.   Skin adhesive strips will fall off on their own. You may trim the strips as the wound heals. Do not remove skin adhesive strips that are still stuck to the wound. They will fall off in time.  For Wound Adhesive:  You may briefly wet your wound in the shower or bath. Do not soak or scrub the wound. Do not swim. Avoid periods of heavy sweating until the skin adhesive has fallen off on its own. After showering or bathing, gently pat the wound dry with a clean towel.   Do not apply liquid medicine, cream medicine, ointment medicine, or makeup to your wound while the skin adhesive is in place. This may loosen the film before your wound is healed.   If a dressing is placed over the wound, be careful not to apply tape directly over the skin adhesive. This may cause the adhesive to be pulled off before the wound is healed.   Avoid prolonged exposure to sunlight or tanning lamps while the skin adhesive is in place.  The skin adhesive will usually remain in place for 5-10 days, then naturally fall off the skin. Do not pick at the adhesive film.  After Healing: Once the wound has healed, cover the wound with sunscreen during the day for 1 full year. This can help minimize scarring. Exposure to ultraviolet light in the first year will darken the scar. It can take 1-2 years for the scar to lose its redness and to heal completely.  SEEK IMMEDIATE MEDICAL CARE IF:  You have redness, pain, or swelling around the wound.   You see ayellowish-white fluid (pus) coming from the wound.   You have chills or a fever.  MAKE SURE YOU:  Understand these instructions.  Will watch your condition.  Will get help right away if you are not doing well or get worse. Document Released: 06/04/2004 Document Revised: 02/15/2013 Document Reviewed: 12/08/2012 [facility] Patient Information 2015 [place], [place]. This information is not intended to replace advice given to you by your  health care provider. Make sure you discuss any questions you have with your health care provider.

## 2014-11-28 NOTE — ED Notes (Signed)
Per EMS, pt tripped on pavement in 3-inch heels and fell down, small laceration over right eye, abrasion to right shoulder and left hand. Denies use of anticoagulants.

## 2014-11-28 NOTE — ED Notes (Signed)
Bed: WTR7 Expected date:  Expected time:  Means of arrival:  Comments: EMS fall 

## 2017-06-02 ENCOUNTER — Other Ambulatory Visit: Payer: Self-pay | Admitting: Family Medicine

## 2017-06-02 DIAGNOSIS — Z139 Encounter for screening, unspecified: Secondary | ICD-10-CM

## 2017-06-03 ENCOUNTER — Ambulatory Visit
Admission: RE | Admit: 2017-06-03 | Discharge: 2017-06-03 | Disposition: A | Discharge status: Home / Self Care | Payer: Medicare Other | Source: Ambulatory Visit | Attending: Family Medicine | Admitting: Family Medicine

## 2017-06-03 DIAGNOSIS — Z139 Encounter for screening, unspecified: Secondary | ICD-10-CM

## 2017-12-14 ENCOUNTER — Emergency Department (HOSPITAL_COMMUNITY): Payer: Medicare Other

## 2017-12-14 ENCOUNTER — Observation Stay (HOSPITAL_COMMUNITY)
Admission: EM | Admit: 2017-12-14 | Discharge: 2017-12-16 | Disposition: A | Discharge status: Home / Self Care | Payer: Medicare Other | Attending: Family Medicine | Admitting: Family Medicine

## 2017-12-14 ENCOUNTER — Encounter (HOSPITAL_COMMUNITY): Payer: Self-pay

## 2017-12-14 DIAGNOSIS — Z79890 Hormone replacement therapy (postmenopausal): Secondary | ICD-10-CM | POA: Insufficient documentation

## 2017-12-14 DIAGNOSIS — R079 Chest pain, unspecified: Secondary | ICD-10-CM | POA: Diagnosis not present

## 2017-12-14 DIAGNOSIS — E785 Hyperlipidemia, unspecified: Secondary | ICD-10-CM

## 2017-12-14 DIAGNOSIS — G4700 Insomnia, unspecified: Secondary | ICD-10-CM

## 2017-12-14 DIAGNOSIS — Z9889 Other specified postprocedural states: Secondary | ICD-10-CM | POA: Insufficient documentation

## 2017-12-14 DIAGNOSIS — Z947 Corneal transplant status: Secondary | ICD-10-CM | POA: Diagnosis not present

## 2017-12-14 DIAGNOSIS — I5030 Unspecified diastolic (congestive) heart failure: Secondary | ICD-10-CM | POA: Insufficient documentation

## 2017-12-14 DIAGNOSIS — Z90710 Acquired absence of both cervix and uterus: Secondary | ICD-10-CM | POA: Diagnosis not present

## 2017-12-14 DIAGNOSIS — F329 Major depressive disorder, single episode, unspecified: Secondary | ICD-10-CM | POA: Insufficient documentation

## 2017-12-14 DIAGNOSIS — I110 Hypertensive heart disease with heart failure: Secondary | ICD-10-CM | POA: Diagnosis not present

## 2017-12-14 DIAGNOSIS — F32A Depression, unspecified: Secondary | ICD-10-CM

## 2017-12-14 DIAGNOSIS — I10 Essential (primary) hypertension: Secondary | ICD-10-CM

## 2017-12-14 DIAGNOSIS — Z79899 Other long term (current) drug therapy: Secondary | ICD-10-CM | POA: Diagnosis not present

## 2017-12-14 DIAGNOSIS — E039 Hypothyroidism, unspecified: Secondary | ICD-10-CM | POA: Diagnosis not present

## 2017-12-14 DIAGNOSIS — Z818 Family history of other mental and behavioral disorders: Secondary | ICD-10-CM | POA: Diagnosis not present

## 2017-12-14 DIAGNOSIS — Z8249 Family history of ischemic heart disease and other diseases of the circulatory system: Secondary | ICD-10-CM | POA: Diagnosis not present

## 2017-12-14 LAB — BASIC METABOLIC PANEL
ANION GAP: 11 (ref 5–15)
BUN: 6 mg/dL — ABNORMAL LOW (ref 8–23)
CO2: 28 mmol/L (ref 22–32)
Calcium: 9.7 mg/dL (ref 8.9–10.3)
Chloride: 99 mmol/L (ref 98–111)
Creatinine, Ser: 0.85 mg/dL (ref 0.44–1.00)
GLUCOSE: 108 mg/dL — AB (ref 70–99)
Potassium: 3 mmol/L — ABNORMAL LOW (ref 3.5–5.1)
Sodium: 138 mmol/L (ref 135–145)

## 2017-12-14 LAB — CBC
HCT: 43.3 % (ref 36.0–46.0)
HEMOGLOBIN: 13.7 g/dL (ref 12.0–15.0)
MCH: 26.5 pg (ref 26.0–34.0)
MCHC: 31.6 g/dL (ref 30.0–36.0)
MCV: 83.8 fL (ref 78.0–100.0)
Platelets: 230 10*3/uL (ref 150–400)
RBC: 5.17 MIL/uL — AB (ref 3.87–5.11)
RDW: 13.5 % (ref 11.5–15.5)
WBC: 5 10*3/uL (ref 4.0–10.5)

## 2017-12-14 LAB — I-STAT TROPONIN, ED: Troponin i, poc: 0.01 ng/mL (ref 0.00–0.08)

## 2017-12-14 NOTE — ED Triage Notes (Signed)
Pt presents with mid-sternal chest pain while at a meeting at church.  Pt reports having shortness of breath, pt denies any radiation of pain.  Pt reports pain has been constant since onset but is resolving.

## 2017-12-15 ENCOUNTER — Encounter (HOSPITAL_COMMUNITY): Payer: Self-pay | Admitting: Emergency Medicine

## 2017-12-15 ENCOUNTER — Observation Stay (HOSPITAL_BASED_OUTPATIENT_CLINIC_OR_DEPARTMENT_OTHER): Payer: Medicare Other

## 2017-12-15 ENCOUNTER — Other Ambulatory Visit: Payer: Self-pay

## 2017-12-15 DIAGNOSIS — I10 Essential (primary) hypertension: Secondary | ICD-10-CM | POA: Diagnosis not present

## 2017-12-15 DIAGNOSIS — E785 Hyperlipidemia, unspecified: Secondary | ICD-10-CM | POA: Diagnosis not present

## 2017-12-15 DIAGNOSIS — R079 Chest pain, unspecified: Secondary | ICD-10-CM | POA: Diagnosis not present

## 2017-12-15 DIAGNOSIS — E039 Hypothyroidism, unspecified: Secondary | ICD-10-CM

## 2017-12-15 DIAGNOSIS — F329 Major depressive disorder, single episode, unspecified: Secondary | ICD-10-CM

## 2017-12-15 DIAGNOSIS — F32A Depression, unspecified: Secondary | ICD-10-CM

## 2017-12-15 DIAGNOSIS — G4700 Insomnia, unspecified: Secondary | ICD-10-CM

## 2017-12-15 LAB — TROPONIN I
TROPONIN I: 0.04 ng/mL — AB (ref <0.03)
TROPONIN I: 0.05 ng/mL — AB (ref <0.03)
Troponin I: 0.06 ng/mL (ref <0.03)

## 2017-12-15 LAB — ECHOCARDIOGRAM COMPLETE
HEIGHTINCHES: 68 in
Weight: 2800 oz

## 2017-12-15 LAB — I-STAT TROPONIN, ED: Troponin i, poc: 0.08 ng/mL (ref 0.00–0.08)

## 2017-12-15 MED ORDER — GI COCKTAIL ~~LOC~~
30.0000 mL | Freq: Once | ORAL | Status: AC
  Administered 2017-12-15: 30 mL via ORAL
  Filled 2017-12-15: qty 30

## 2017-12-15 MED ORDER — OLMESARTAN MEDOXOMIL-HCTZ 20-12.5 MG PO TABS: 1.0000 | ORAL_TABLET | Freq: Every day | ORAL | Status: DC

## 2017-12-15 MED ORDER — LEVOTHYROXINE SODIUM 75 MCG PO TABS
75.0000 ug | ORAL_TABLET | Freq: Every day | ORAL | Status: DC
  Administered 2017-12-15 – 2017-12-16 (×2): 75 ug via ORAL
  Filled 2017-12-15 (×2): qty 1

## 2017-12-15 MED ORDER — ATORVASTATIN CALCIUM 40 MG PO TABS
40.0000 mg | ORAL_TABLET | Freq: Every evening | ORAL | Status: DC
  Administered 2017-12-15: 40 mg via ORAL
  Filled 2017-12-15: qty 1

## 2017-12-15 MED ORDER — GI COCKTAIL ~~LOC~~: 30.0000 mL | Freq: Four times a day (QID) | ORAL | Status: DC | PRN

## 2017-12-15 MED ORDER — HYDROCHLOROTHIAZIDE 12.5 MG PO CAPS
12.5000 mg | ORAL_CAPSULE | Freq: Every day | ORAL | Status: DC
  Administered 2017-12-15 – 2017-12-16 (×2): 12.5 mg via ORAL
  Filled 2017-12-15 (×2): qty 1

## 2017-12-15 MED ORDER — ONDANSETRON HCL 4 MG/2ML IJ SOLN: 4.0000 mg | Freq: Four times a day (QID) | INTRAMUSCULAR | Status: DC | PRN

## 2017-12-15 MED ORDER — ZOLPIDEM TARTRATE 5 MG PO TABS
5.0000 mg | ORAL_TABLET | Freq: Every day | ORAL | Status: DC
  Administered 2017-12-15: 5 mg via ORAL
  Filled 2017-12-15: qty 1

## 2017-12-15 MED ORDER — ASPIRIN EC 325 MG PO TBEC
325.0000 mg | DELAYED_RELEASE_TABLET | Freq: Every day | ORAL | Status: DC
  Administered 2017-12-16: 325 mg via ORAL
  Filled 2017-12-15: qty 1

## 2017-12-15 MED ORDER — IRBESARTAN 300 MG PO TABS
150.0000 mg | ORAL_TABLET | Freq: Every day | ORAL | Status: DC
  Administered 2017-12-15 – 2017-12-16 (×2): 150 mg via ORAL
  Filled 2017-12-15 (×2): qty 1

## 2017-12-15 MED ORDER — AMLODIPINE BESYLATE 5 MG PO TABS
2.5000 mg | ORAL_TABLET | Freq: Once | ORAL | Status: AC
  Administered 2017-12-15: 2.5 mg via ORAL
  Filled 2017-12-15: qty 1

## 2017-12-15 MED ORDER — ASPIRIN 81 MG PO CHEW
324.0000 mg | CHEWABLE_TABLET | Freq: Once | ORAL | Status: AC
  Administered 2017-12-15: 324 mg via ORAL
  Filled 2017-12-15: qty 4

## 2017-12-15 MED ORDER — POTASSIUM CHLORIDE CRYS ER 20 MEQ PO TBCR
60.0000 meq | EXTENDED_RELEASE_TABLET | Freq: Once | ORAL | Status: AC
  Administered 2017-12-15: 60 meq via ORAL
  Filled 2017-12-15: qty 3

## 2017-12-15 MED ORDER — ACETAMINOPHEN 325 MG PO TABS: 650.0000 mg | ORAL_TABLET | ORAL | Status: DC | PRN

## 2017-12-15 MED ORDER — CITALOPRAM HYDROBROMIDE 20 MG PO TABS
20.0000 mg | ORAL_TABLET | Freq: Every day | ORAL | Status: DC
  Administered 2017-12-15 – 2017-12-16 (×2): 20 mg via ORAL
  Filled 2017-12-15 (×2): qty 1

## 2017-12-15 MED ORDER — MORPHINE SULFATE (PF) 2 MG/ML IV SOLN: 2.0000 mg | INTRAVENOUS | Status: DC | PRN

## 2017-12-15 NOTE — ED Provider Notes (Addendum)
[facility] EMERGENCY DEPARTMENT Provider Note   CSN: [id number] Arrival date & time: 12/14/17  2144     History   Chief Complaint No chief complaint on file.   HPI Virginia Becker is a 72 y.o. female.  The history is provided by the patient.  Hypertension  This is a new problem. The current episode started more than 1 week ago. The problem occurs constantly. The problem has been gradually worsening. Associated symptoms include abdominal pain. Pertinent negatives include no headaches and no shortness of breath. Associated symptoms comments: epigastric pain after hearing that niece and family were at ED at [facility] secondary to carbon monoxide poisoning  . Nothing aggravates the symptoms. Nothing relieves the symptoms. She has tried nothing for the symptoms. The treatment provided no relief.  Recently started on BP medication by PMD and tonight BP was 199/ 98 following hearing about family member.  No DOE, no exertional symptoms.  No n/v/d.  No radiation.  No back pain.  Pain in epigastrum is cramping.    Past Medical History:  Diagnosis Date  . Depression   . Hyperlipidemia   . Hypertension   . Thyroid disease     Patient Active Problem List   Diagnosis Date Noted  . Abdominal pain 07/23/2013  . UTI (urinary tract infection) 07/20/2013  . Diverticulitis of intestine with abscess without bleeding 07/18/2013  . Hypothyroid 07/18/2013  . Leukocytosis 07/18/2013    Past Surgical History:  Procedure Laterality Date  . ABDOMINAL HYSTERECTOMY    . bone spur    . CORNEAL TRANSPLANT    . EYE SURGERY    . THYROID SURGERY    . TONSILLECTOMY       OB History   None      Home Medications    Prior to Admission medications   Medication Sig Start Date End Date Taking? Authorizing Provider  atorvastatin (LIPITOR) 40 MG tablet Take 40 mg by mouth every evening.    Yes [provider]  citalopram (CELEXA) 20 MG tablet Take 20 mg by mouth daily.   Yes  [provider]  INTRAROSA 6.5 MG INST Place 6.5 mg vaginally daily.  09/15/17  Yes [provider]  levothyroxine (SYNTHROID, LEVOTHROID) 75 MCG tablet Take 75 mcg by mouth daily. 11/21/17  Yes [provider]  linaclotide (LINZESS) 72 MCG capsule Take 72 mcg by mouth daily before breakfast.   Yes [provider]  olmesartan-hydrochlorothiazide (BENICAR HCT) 20-12.5 MG tablet Take 1 tablet by mouth daily. 11/16/17  Yes [provider]  zolpidem (AMBIEN) 10 MG tablet Take 5 mg by mouth at bedtime.    Yes [provider]  metroNIDAZOLE (FLAGYL) 500 MG tablet Take 1 tablet (500 mg total) by mouth 3 (three) times daily. For 6 days. Patient not taking: Reported on 12/15/2017 07/23/13   Rodolph Bong, MD    Family History Family History  Problem Relation Age of Onset  . Cancer Mother        pancreatic  . Mental illness Mother   . Heart disease Father   . Mental illness Father   . Hypertension Sister   . Mental illness Sister   . Hypertension Brother   . Breast cancer Maternal Aunt   . Breast cancer Maternal Aunt     Social History Social History   Tobacco Use  . Smoking status: Never Smoker  . Smokeless tobacco: Never Used  Substance Use Topics  . Alcohol use: No  .  Drug use: No     Allergies   Patient has no known allergies.   Review of Systems Review of Systems  Constitutional: Negative for diaphoresis and fever.  Respiratory: Negative for chest tightness and shortness of breath.   Cardiovascular: Negative for palpitations and leg swelling.  Gastrointestinal: Positive for abdominal pain. Negative for nausea and vomiting.  Neurological: Negative for headaches.  All other systems reviewed and are negative.    Physical Exam Updated Vital Signs BP (!) 177/95   Pulse 72   Temp 98.5 F (36.9 C) (Oral)   Resp 14   Ht 5\' 8"  (1.727 m)   Wt 79.4 kg (175 lb)   SpO2 100%   BMI 26.61 kg/m   Physical Exam    Constitutional: She is oriented to person, place, and time. She appears well-developed and well-nourished. No distress.  HENT:  Head: Normocephalic and atraumatic.  Mouth/Throat: No oropharyngeal exudate.  Eyes: Pupils are equal, round, and reactive to light. Conjunctivae are normal.  Neck: Normal range of motion. Neck supple.  Cardiovascular: Normal rate, regular rhythm, normal heart sounds and intact distal pulses.  Pulmonary/Chest: Effort normal and breath sounds normal. No stridor. She has no wheezes. She has no rales.  Abdominal: Soft. She exhibits no mass. Bowel sounds are increased. There is no tenderness. There is no rebound, no guarding, no tenderness at McBurney's point and negative Murphy's sign.  Gassy   Musculoskeletal: Normal range of motion.  Neurological: She is alert and oriented to person, place, and time. She displays normal reflexes.  Skin: Skin is warm and dry. Capillary refill takes less than 2 seconds.  Psychiatric: Her mood appears anxious.     ED Treatments / Results  Labs (all labs ordered are listed, but only abnormal results are displayed) Results for orders placed or performed during the hospital encounter of 12/14/17  Basic metabolic panel  Result Value Ref Range   Sodium 138 135 - 145 mmol/L   Potassium 3.0 (L) 3.5 - 5.1 mmol/L   Chloride 99 98 - 111 mmol/L   CO2 28 22 - 32 mmol/L   Glucose, Bld 108 (H) 70 - 99 mg/dL   BUN 6 (L) 8 - 23 mg/dL   Creatinine, Ser 7.820.85 0.44 - 1.00 mg/dL   Calcium 9.7 8.9 - 95.610.3 mg/dL   GFR calc non Af Amer >60 >60 mL/min   GFR calc Af Amer >60 >60 mL/min   Anion gap 11 5 - 15  CBC  Result Value Ref Range   WBC 5.0 4.0 - 10.5 K/uL   RBC 5.17 (H) 3.87 - 5.11 MIL/uL   Hemoglobin 13.7 12.0 - 15.0 g/dL   HCT 21.343.3 08.636.0 - 57.846.0 %   MCV 83.8 78.0 - 100.0 fL   MCH 26.5 26.0 - 34.0 pg   MCHC 31.6 30.0 - 36.0 g/dL   RDW 46.913.5 62.911.5 - 52.815.5 %   Platelets 230 150 - 400 K/uL  I-stat troponin, ED  Result Value Ref Range    Troponin i, poc 0.01 0.00 - 0.08 ng/mL   Comment 3          I-stat troponin, ED  Result Value Ref Range   Troponin i, poc 0.08 0.00 - 0.08 ng/mL   Comment 3           Dg Chest 2 View  Result Date: 12/14/2017 CLINICAL DATA:  Chest pain EXAM: CHEST - 2 VIEW COMPARISON:  11/28/2014 FINDINGS: The heart size and mediastinal contours are within normal limits.  Both lungs are clear. The visualized skeletal structures are unremarkable. IMPRESSION: No active cardiopulmonary disease. Electronically Signed   By: Deatra Robinson M.D.   On: 12/14/2017 22:20    EKG EKG Interpretation  Date/Time:  Tuesday December 14 2017 21:52:30 EDT Ventricular Rate:  84 PR Interval:  160 QRS Duration: 98 QT Interval:  398 QTC Calculation: 470 R Axis:   37 Text Interpretation:  Normal sinus rhythm Confirmed by Nicanor Alcon, Lometa Riggin ([id number]) on 12/15/2017 3:57:48 AM   Radiology Dg Chest 2 View  Result Date: 12/14/2017 CLINICAL DATA:  Chest pain EXAM: CHEST - 2 VIEW COMPARISON:  11/28/2014 FINDINGS: The heart size and mediastinal contours are within normal limits. Both lungs are clear. The visualized skeletal structures are unremarkable. IMPRESSION: No active cardiopulmonary disease. Electronically Signed   By: Deatra Robinson M.D.   On: 12/14/2017 22:20    Procedures Procedures (including critical care time)  Medications Ordered in ED Medications  gi cocktail (Maalox,Lidocaine,Donnatal) (30 mLs Oral Given 12/15/17 0447)  potassium chloride SA (K-DUR,KLOR-CON) CR tablet 60 mEq (60 mEq Oral Given 12/15/17 0504)      Final Clinical Impressions(s) / ED Diagnoses   BP is elevated likely more secondary to stress, 2 negative troponins over 7 hours apart. Moreover, patient's pain is in epigastrum and is extremely gassy. Will treat BP and have patient follow up with cardiology for outpatient stress test and ongoing management of BP.     Return for pain, numbness, changes in vision or speech, fevers >100.4 unrelieved by  medication, shortness of breath, intractable vomiting, or diarrhea, abdominal pain, Inability to tolerate liquids or food, cough, altered mental status or any concerns. No signs of systemic illness or infection. The patient is nontoxic-appearing on exam and vital signs are within normal limits. Will refer to urology for microscopy hematuria as patient is asymptomatic.  I have reviewed the triage vital signs and the nursing notes. Pertinent labs &imaging results that were available during my care of the patient were reviewed by me and considered in my medical decision making (see chart for details).  After history, exam, and medical workup I feel the patient has been appropriately medically screened and is safe for discharge home. Pertinent diagnoses were discussed with the patient. Patient was given return precautions.   Careli Luzader, MD 12/15/17 [id number]    Cy Blamer, MD 12/15/17 8295    Cy Blamer, MD 12/15/17 [id number]

## 2017-12-15 NOTE — ED Notes (Signed)
Dr Goodrich at the bedside.

## 2017-12-15 NOTE — ED Notes (Signed)
Pt. Transferred to Vascular for Echo

## 2017-12-15 NOTE — ED Notes (Addendum)
Error in charting.

## 2017-12-15 NOTE — ED Notes (Signed)
Pt. Returned from Echo.

## 2017-12-15 NOTE — Progress Notes (Signed)
Alert and oriented female admitted to unit with family in attendance. No interruption in skin. Alert and oriented. No c/o voiced. Oriented to room and call bell at side.

## 2017-12-15 NOTE — ED Notes (Signed)
Messaged pharmacy to adjust times of medication and verify.

## 2017-12-15 NOTE — ED Notes (Signed)
Clear Liquid Diet was ordered for Lunch. 

## 2017-12-15 NOTE — Progress Notes (Signed)
  Echocardiogram 2D Echocardiogram has been performed.  Virginia Becker, Virginia Becker 12/15/2017, 3:02 PM

## 2017-12-15 NOTE — H&P (Signed)
History and Physical  Virginia Becker:[id number] DOB: 1945-08-28 DOA: 12/14/2017  PCP: Mila Palmer, MD   Chief Complaint: chest pain  HPI:  72 year old woman PMH hypertension, hyperlipidemia, hypothyroidism, presented to the emergency department with central chest pain, referred for observation for further evaluation.  Developed central chest, left breast, upper abdominal pain last evening 8/6 approximately 8-9 PM while at church.  Thought initially to be related to gas.  Symptoms moderate, no associated nausea, vomiting or shortness of breath.  Aggravated by movement, relieved with rest.  Dramatically improved with GI cocktail in the emergency department, now has minimal pain.  No previous history of cardiac disease, no recent travel, no history of blood clots.  ED Course: Noted to be significantly hypertensive which improved with 1 dose of amlodipine.  Also treated with GI cocktail and potassium.  Review of Systems:  Negative for fever, new visual changes, sore throat, rash, new muscle aches,SOB, dysuria, bleeding, n/v/ lower abdominal pain, diarrhea.  Past Medical History:  Diagnosis Date  . Depression   . Hyperlipidemia   . Hypertension   . Thyroid disease     Past Surgical History:  Procedure Laterality Date  . ABDOMINAL HYSTERECTOMY    . bone spur    . CORNEAL TRANSPLANT    . EYE SURGERY    . THYROID SURGERY    . TONSILLECTOMY       reports that she has never smoked. She has never used smokeless tobacco. She reports that she does not drink alcohol or use drugs. Mobility: Ambulatory  No Known Allergies  Family History  Problem Relation Age of Onset  . Cancer Mother        pancreatic  . Mental illness Mother   . Heart disease Father   . Mental illness Father   . Hypertension Sister   . Mental illness Sister   . Hypertension Brother   . Breast cancer Maternal Aunt   . Breast cancer Maternal Aunt      Prior to Admission medications   Medication Sig  Start Date End Date Taking? Authorizing Provider  atorvastatin (LIPITOR) 40 MG tablet Take 40 mg by mouth every evening.    Yes [provider]  citalopram (CELEXA) 20 MG tablet Take 20 mg by mouth daily.   Yes [provider]  INTRAROSA 6.5 MG INST Place 6.5 mg vaginally daily.  09/15/17  Yes [provider]  levothyroxine (SYNTHROID, LEVOTHROID) 75 MCG tablet Take 75 mcg by mouth daily. 11/21/17  Yes [provider]  linaclotide (LINZESS) 72 MCG capsule Take 72 mcg by mouth daily before breakfast.   Yes [provider]  olmesartan-hydrochlorothiazide (BENICAR HCT) 20-12.5 MG tablet Take 1 tablet by mouth daily. 11/16/17  Yes [provider]  zolpidem (AMBIEN) 10 MG tablet Take 5 mg by mouth at bedtime.    Yes [provider]  metroNIDAZOLE (FLAGYL) 500 MG tablet Take 1 tablet (500 mg total) by mouth 3 (three) times daily. For 6 days. Patient not taking: Reported on 12/15/2017 07/23/13   Rodolph Bong, MD    Physical Exam: Vitals:   12/15/17 0730 12/15/17 0745  BP: (!) 151/80 (!) 157/92  Pulse: 71 94  Resp: 15 (!) 22  Temp:    SpO2: 99% 100%    Constitutional:   . Appears calm and comfortable Eyes:  . pupils and irises appear normal . Normal lids  ENMT:  . grossly normal hearing  . Lips appear normal Neck:  . neck appears normal,  no masses . no thyromegaly Respiratory:  . CTA bilaterally, no w/r/r.  . Respiratory effort normal.  Cardiovascular:  . RRR, no m/r/g . No LE extremity edema   Abdomen:  . Abdomen appears normal; no tenderness or masses . No hernias noted Musculoskeletal:  . RUE, LUE . strength and tone appear grossly normal Skin:  . No rashes, lesions, ulcers . palpation of skin: no induration or nodules Neurologic:  . Appears grossly intact Psychiatric:  . Mental status o Mood, affect appropriate . judgment and insight appear intact    I have personally reviewed following labs and imaging  studies  Labs:   Potassium 3.0, remainder BMP unremarkable.  CBC unremarkable  Troponin 0.01, 0.08 (point-of-care)  Imaging studies:   Chest x-ray independently reviewed, no acute disease  Medical tests:   EKG independently reviewed: Normal sinus rhythm, no acute changes  Review and summation of old records:   No office visits or hospitalizations in the last 3 years on file  Hospitalized in 2015 for acute diverticulitis  Principal Problem:   Chest pain Active Problems:   Hypothyroid   Benign essential HTN   Depression   Insomnia   Hyperlipidemia   Assessment/Plan Chest pain with some typical, atypical features.  Improved with GI cocktail.  Prominent bowel sounds on examination.  Abdomen benign.  Troponin within normal limits but second value modestly elevated compared to first. Well's score for PE is zero. --Suspect noncardiac, doubt ACS at this time, however plan observation given mild elevation of troponin, serial troponins, echocardiogram, telemetry. --ASA now --If no recurrent pain and work-up is negative, likely discharge within 24 hours with outpatient cardiology follow-up.  If condition changes or troponins elevated, would consider cardiology consultation.  Accelerated hypertension/essential hypertension.  Blood pressure now appears stable.  Patient reports typically runs systolic less than 120. --Monitor clinically.  Continue Benicar.  Hyperlipidemia NOS --Continue atorvastatin  Depression NOS --Stable.  Continue Celexa.  Insomnia. --Continue low-dose Ambien at night.  Severity of Illness: The appropriate patient status for this patient is OBSERVATION. Observation status is judged to be reasonable and necessary in order to provide the required intensity of service to ensure the patient's safety. The patient's presenting symptoms, physical exam findings, and initial radiographic and laboratory data in the context of their medical condition is felt to place  them at decreased risk for further clinical deterioration. Furthermore, it is anticipated that the patient will be medically stable for discharge from the hospital within 2 midnights of admission.  DVT prophylaxis:SCDs Code Status: Full Family Communication: none Consults called: none    Time spent: 55 minutes  Brendia Sacksaniel Youcef Klas, MD  [facility] Direct contact: [phone] --Via amion app OR  --[email or web address]; password [email or web address]  7PM-7AM contact night coverage as above  12/15/2017, 8:27 AM

## 2017-12-16 DIAGNOSIS — R079 Chest pain, unspecified: Secondary | ICD-10-CM | POA: Diagnosis not present

## 2017-12-16 DIAGNOSIS — E785 Hyperlipidemia, unspecified: Secondary | ICD-10-CM | POA: Diagnosis not present

## 2017-12-16 DIAGNOSIS — I10 Essential (primary) hypertension: Secondary | ICD-10-CM | POA: Diagnosis not present

## 2017-12-16 NOTE — Progress Notes (Signed)
Discharge instructions reviewed with pt. Pt verbalizes understanding and states she has no questions. Pt belongings with pt. Pt is not in distress. Pt's husband is driving her home. Pt discharged via wheelchair.

## 2017-12-16 NOTE — Plan of Care (Signed)

## 2017-12-16 NOTE — Discharge Summary (Addendum)
Physician Discharge Summary  Virginia HarpsLinda B Becker ZOX:[id number] DOB: 09/06/1945 DOA: 12/14/2017  PCP: [name], [name], MD  Admit date: 12/14/2017 Discharge date: 12/16/2017  Recommendations for Outpatient Follow-up:  1. Atypical chest pain.  Follow-up Information    [name], [name], MD. Go on 12/20/2017.   Specialty:  Family Medicine Why:  @10 :30am Contact information: [address] [place] [address] [phone]            Discharge Diagnoses:  1. Chest pain with some typical, atypical features.    Accelerated hypertension/essential hypertension 2. Essential HTN 3. Hyperlipidemia   Discharge Condition: improved Disposition: home  Diet recommendation: heart healthy  Filed Weights   12/14/17 2153 12/15/17 1732 12/16/17 0531  Weight: 79.4 kg 77.5 kg 76.7 kg    History of present illness:  72 year old woman PMH hypertension, hyperlipidemia, hypothyroidism, presented to the emergency department with central chest pain, referred for observation for further evaluation.  Hospital Course:  Patient was observed overnight, she had no recurrent pain.  Troponins were flat, symptoms were atypical and did not require any further evaluation.  Hospitalization was uncomplicated individual issues as below.  Chest pain with some typical, atypical features.    Resolved with GI cocktail.  Prominent bowel sounds on examination.  Abdomen benign.   Well's score for PE is zero. --Most likely related to gas.  Work-up unremarkable, troponins flat, EKG nonacute, echo reassuring.  No further evaluation suggested.  Accelerated hypertension/essential hypertension.  Blood pressure now appears stable.  Patient reports typically runs systolic less than 120. --Sugar well controlled in the hospital.  Hyperlipidemia NOS --Continue atorvastatin  Procedures:  Echo Study Conclusions  - Left ventricle: The cavity size was normal. Wall thickness was   normal. Systolic function was  normal. The estimated ejection   fraction was in the range of 60% to 65%. Wall motion was normal;   there were no regional wall motion abnormalities. Doppler   parameters are consistent with abnormal left ventricular   relaxation (grade 1 diastolic dysfunction). - Aortic valve: There was no stenosis. - Mitral valve: There was no significant regurgitation. - Right ventricle: The cavity size was normal. Systolic function   was normal. - Pulmonary arteries: No complete TR doppler jet so unable to   estimate PA systolic pressure. - Inferior vena cava: The vessel was normal in size. The   respirophasic diameter changes were in the normal range (= 50%),   consistent with normal central venous pressure.  Impressions:  - Normal LV size with EF 60-65%. Normal RV size and systolic   function. No significant valvular abnormalities.  Today's assessment: S: feels well, no CP, no complaints. O: Vitals:  Vitals:   12/16/17 0813 12/16/17 1132  BP: 123/78 129/85  Pulse: 82 74  Resp:  18  Temp:  99 F (37.2 C)  SpO2:  93%    Constitutional:  . Appears calm and comfortable Respiratory:  . CTA bilaterally, no w/r/r.  . Respiratory effort normal.  Cardiovascular:  . RRR, no m/r/g Psychiatric:  . Mental status o Mood, affect appropriate  Troponins flat, 0.06, 0.04, 0.05.  Discharge Instructions  Discharge Instructions    Diet - low sodium heart healthy   Complete by:  As directed    Discharge instructions   Complete by:  As directed    Call your physician or seek immediate medical attention for chest pain, shortness of breath or worsening of condition.   Increase activity slowly   Complete by:  As directed  Allergies as of 12/16/2017   No Known Allergies     Medication List    STOP taking these medications   LINZESS 72 MCG capsule Generic drug:  linaclotide   metroNIDAZOLE 500 MG tablet Commonly known as:  FLAGYL     TAKE these medications   atorvastatin 40 MG  tablet Commonly known as:  LIPITOR Take 40 mg by mouth every evening.   citalopram 20 MG tablet Commonly known as:  CELEXA Take 20 mg by mouth daily.   INTRAROSA 6.5 MG Inst Generic drug:  Prasterone Place 6.5 mg vaginally daily.   levothyroxine 75 MCG tablet Commonly known as:  SYNTHROID, LEVOTHROID Take 75 mcg by mouth daily.   olmesartan-hydrochlorothiazide 20-12.5 MG tablet Commonly known as:  BENICAR HCT Take 1 tablet by mouth daily.   zolpidem 10 MG tablet Commonly known as:  AMBIEN Take 5 mg by mouth at bedtime.      No Known Allergies  The results of significant diagnostics from this hospitalization (including imaging, microbiology, ancillary and laboratory) are listed below for reference.    Significant Diagnostic Studies: Dg Chest 2 View  Result Date: 12/14/2017 CLINICAL DATA:  Chest pain EXAM: CHEST - 2 VIEW COMPARISON:  11/28/2014 FINDINGS: The heart size and mediastinal contours are within normal limits. Both lungs are clear. The visualized skeletal structures are unremarkable. IMPRESSION: No active cardiopulmonary disease. Electronically Signed   By: Deatra Robinson M.D.   On: 12/14/2017 22:20    Labs: Basic Metabolic Panel: Recent Labs  Lab 12/14/17 2155  NA 138  K 3.0*  CL 99  CO2 28  GLUCOSE 108*  BUN 6*  CREATININE 0.85  CALCIUM 9.7   CBC: Recent Labs  Lab 12/14/17 2155  WBC 5.0  HGB 13.7  HCT 43.3  MCV 83.8  PLT 230    Recent Labs  Lab 12/15/17 1045 12/15/17 1708 12/15/17 2223  TROPONINI 0.06* 0.04* 0.05*    Principal Problem:   Chest pain Active Problems:   Hypothyroid   Benign essential HTN   Depression   Insomnia   Hyperlipidemia   Time coordinating discharge: 25 minutes  Signed:  Brendia Sacks, MD [facility] 12/16/2017, 6:36 PM

## 2018-05-18 ENCOUNTER — Other Ambulatory Visit: Payer: Self-pay | Admitting: Family Medicine

## 2018-05-18 DIAGNOSIS — Z1231 Encounter for screening mammogram for malignant neoplasm of breast: Secondary | ICD-10-CM

## 2018-06-16 ENCOUNTER — Ambulatory Visit
Admission: RE | Admit: 2018-06-16 | Discharge: 2018-06-16 | Disposition: A | Discharge status: Home / Self Care | Payer: Medicare Other | Source: Ambulatory Visit | Attending: Family Medicine | Admitting: Family Medicine

## 2018-06-16 DIAGNOSIS — Z1231 Encounter for screening mammogram for malignant neoplasm of breast: Secondary | ICD-10-CM

## 2018-06-29 ENCOUNTER — Ambulatory Visit (INDEPENDENT_AMBULATORY_CARE_PROVIDER_SITE_OTHER): Payer: Self-pay

## 2018-06-29 ENCOUNTER — Ambulatory Visit (INDEPENDENT_AMBULATORY_CARE_PROVIDER_SITE_OTHER): Payer: Medicare Other | Admitting: Family Medicine

## 2018-06-29 ENCOUNTER — Encounter (INDEPENDENT_AMBULATORY_CARE_PROVIDER_SITE_OTHER): Payer: Self-pay | Admitting: Family Medicine

## 2018-06-29 DIAGNOSIS — M79652 Pain in left thigh: Secondary | ICD-10-CM | POA: Diagnosis not present

## 2018-06-29 DIAGNOSIS — M5442 Lumbago with sciatica, left side: Secondary | ICD-10-CM

## 2018-06-29 MED ORDER — TIZANIDINE HCL 2 MG PO TABS: 2.0000 mg | ORAL_TABLET | Freq: Every evening | ORAL | 1 refills | Status: DC | PRN

## 2018-06-29 MED ORDER — NABUMETONE 750 MG PO TABS: 750.0000 mg | ORAL_TABLET | Freq: Two times a day (BID) | ORAL | 6 refills | Status: DC | PRN

## 2018-06-29 NOTE — Progress Notes (Signed)
Office Visit Note   Patient: Virginia Becker           Date of Birth: March 30, 1946           MRN: [id number] Visit Date: 06/29/2018 Requested by: Mila Palmer, MD [address] [place] [address] [place], [place] [address] PCP: Mila Palmer, MD  Subjective: Chief Complaint  Patient presents with  . Left Leg - Numbness, Pain    Pain & numbness lateral thigh to knee since 06/21/2018, after prolonged sitting at a concert. A twinge of pain left lower back.    HPI: She is here with low back and left lateral thigh pain.  A few weeks ago she went to a concert and after sitting for a long time she felt a twinge of pain in her left lower back followed by numbness in her left leg.  She had a tough time walking back to her car.  She is continued to have pain and numbness but no definite weakness, no bowel or bladder dysfunction.  She is not taking anything for her pain.  She has a history of lumbar disc problems in the past which have been treated with conservative measures.  She has not had a flareup in a while.  This episode is little different than usual.              ROS: Otherwise noncontributory  Objective: Vital Signs: There were no vitals taken for this visit.  Physical Exam:  Low back: Slightly tender to the left of midline at the L4-5 level.  No pain at the SI joint or in the sciatic notch.  No pain with internal hip rotation, lower extremity strength and reflexes are normal.  She has lateral thigh numbness to light touch extending below the knee.  Imaging: X-rays lumbar spine: Moderate to severe L5-S1 degenerative disc disease.  No sign of compression fracture or neoplasm.  Hip joints look good.    Assessment & Plan: 1.  Low back and left leg pain, probable lumbar disc protrusion.  Cannot rule out meralgia paresthetica although with history of disc problems I suspect this probably from that. - Medications as needed, HEP and PT referral. - MRI and ESI if  worsens.     Procedures: No procedures performed  No notes on file     PMFS History: Patient Active Problem List   Diagnosis Date Noted  . Chest pain 12/15/2017  . Benign essential HTN 12/15/2017  . Depression 12/15/2017  . Insomnia 12/15/2017  . Hyperlipidemia 12/15/2017  . Abdominal pain 07/23/2013  . Diverticulitis of intestine with abscess without bleeding 07/18/2013  . Hypothyroid 07/18/2013  . Leukocytosis 07/18/2013   Past Medical History:  Diagnosis Date  . Depression   . Hyperlipidemia   . Hypertension   . Thyroid disease     Family History  Problem Relation Age of Onset  . Cancer Mother        pancreatic  . Mental illness Mother   . Heart disease Father   . Mental illness Father   . Hypertension Sister   . Mental illness Sister   . Hypertension Brother   . Breast cancer Maternal Aunt   . Breast cancer Maternal Aunt     Past Surgical History:  Procedure Laterality Date  . ABDOMINAL HYSTERECTOMY    . bone spur    . CORNEAL TRANSPLANT    . EYE SURGERY    . THYROID SURGERY    . TONSILLECTOMY     Social  History   Occupational History  . Not on file  Tobacco Use  . Smoking status: Never Smoker  . Smokeless tobacco: Never Used  Substance and Sexual Activity  . Alcohol use: No  . Drug use: No  . Sexual activity: Yes    Comment: post menopausal

## 2018-07-01 ENCOUNTER — Telehealth (INDEPENDENT_AMBULATORY_CARE_PROVIDER_SITE_OTHER): Payer: Self-pay | Admitting: Family Medicine

## 2018-07-01 NOTE — Telephone Encounter (Signed)
Please advise 

## 2018-07-01 NOTE — Telephone Encounter (Signed)
Patient called wanting to know if she can take the medicine for the muscle spasms other than at night.  She states that she is still in pain and having the spasms in her leg.  CB#[phone].  Thank you.

## 2018-07-01 NOTE — Telephone Encounter (Signed)
I called and advised her of instructions.  The pain is only a little better with taking the antiinflammatory & muscle relaxer. She will continue these + rest and let us know if she fails to improve.

## 2018-07-01 NOTE — Telephone Encounter (Signed)
Yes, can take up to 4 times daily as long as it doesn't make her too drowsy.

## 2018-07-04 ENCOUNTER — Telehealth (INDEPENDENT_AMBULATORY_CARE_PROVIDER_SITE_OTHER): Payer: Self-pay | Admitting: Family Medicine

## 2018-07-04 DIAGNOSIS — M5442 Lumbago with sciatica, left side: Secondary | ICD-10-CM

## 2018-07-04 DIAGNOSIS — M79652 Pain in left thigh: Secondary | ICD-10-CM

## 2018-07-04 NOTE — Telephone Encounter (Signed)
Patient called to state that she called in regards to her appt on 2/19 and was told to increase her medication. Patient stated was not any better.  Wants Terri to call her back. [phone]

## 2018-07-05 MED ORDER — BACLOFEN 10 MG PO TABS: 10.0000 mg | ORAL_TABLET | Freq: Every evening | ORAL | 3 refills | Status: DC | PRN

## 2018-07-05 NOTE — Addendum Note (Signed)
Addended by: Lillia Carmel on: 07/05/2018 12:56 PM   Modules accepted: Orders

## 2018-07-05 NOTE — Telephone Encounter (Signed)
Different muscle relaxant Rx sent.

## 2018-07-05 NOTE — Telephone Encounter (Signed)
I called the patient this morning (our phone lines were down yesterday afternoon): she does have periods of less pain, mainly when she is up and walking around.  If she sits a while or is lying down (any position), the pain returns.  It is not the "deep nerve pain" as before, but still hurts. The leg still feels numb. She tried lying on her back with a pillow under her knees & on her side with a pillow between her knees - still unable to sleep unless she takes the muscle relaxer or a sleeping pill.  With the medication, she only sleeps around 3 hours before waking up with pain again. What else can she do for the pain or what is the next step?

## 2018-07-05 NOTE — Telephone Encounter (Signed)
MRI ordered

## 2018-07-05 NOTE — Telephone Encounter (Signed)
I advised the patient of the new muscle relaxer to take at bedtime. Will hopefully be able to have the MRI of her back soon - she will await a call from the MRI facility.

## 2018-07-05 NOTE — Telephone Encounter (Signed)
I advised the patient of the plan for an MRI. She is taking the Nabumetone bid as instructed, but it is not helping the pain at night. She is requesting something to take for pain at bedtime. Please advise.

## 2018-07-06 ENCOUNTER — Telehealth (INDEPENDENT_AMBULATORY_CARE_PROVIDER_SITE_OTHER): Payer: Self-pay | Admitting: Family Medicine

## 2018-07-06 ENCOUNTER — Ambulatory Visit
Admission: RE | Admit: 2018-07-06 | Discharge: 2018-07-06 | Disposition: A | Discharge status: Home / Self Care | Payer: Medicare Other | Source: Ambulatory Visit | Attending: Family Medicine | Admitting: Family Medicine

## 2018-07-06 DIAGNOSIS — M5442 Lumbago with sciatica, left side: Secondary | ICD-10-CM

## 2018-07-06 DIAGNOSIS — M79652 Pain in left thigh: Secondary | ICD-10-CM

## 2018-07-06 NOTE — Telephone Encounter (Signed)
Patient called stated she was scheduled for an MRI today she stated she didn't speak with [facility] until this morning. Virginia Becker is in a lot of pain she would like to go some where else due to her pain level for her MRI. Patient is scheduled for this afternoon at 5 would like to receive treatment before this time. Please call patient to advise

## 2018-07-06 NOTE — Telephone Encounter (Signed)
Do you know if she can get one anywhere sooner today?

## 2018-07-07 ENCOUNTER — Telehealth (INDEPENDENT_AMBULATORY_CARE_PROVIDER_SITE_OTHER): Payer: Self-pay | Admitting: Family Medicine

## 2018-07-07 DIAGNOSIS — M5126 Other intervertebral disc displacement, lumbar region: Secondary | ICD-10-CM

## 2018-07-07 NOTE — Telephone Encounter (Signed)
I called and advised the patient of the results. She would like to try PT.

## 2018-07-07 NOTE — Telephone Encounter (Signed)
MRI shows left-sided disc protrusion at L3-4 which is impinging on the left L3 nerve root.  I think this explains her pain.  Treatment options would include physical therapy, or possibly epidural injection if pain becomes severe.  Surgery would be last option.

## 2018-07-08 NOTE — Telephone Encounter (Signed)
Pt called asking if terri could give her a call at her earliest convince.

## 2018-07-08 NOTE — Telephone Encounter (Signed)
Orders placed.

## 2018-07-08 NOTE — Addendum Note (Signed)
Addended by: Lillia Carmel on: 07/08/2018 07:54 AM   Modules accepted: Orders

## 2018-07-11 ENCOUNTER — Other Ambulatory Visit (INDEPENDENT_AMBULATORY_CARE_PROVIDER_SITE_OTHER): Payer: Self-pay

## 2018-07-11 ENCOUNTER — Telehealth (INDEPENDENT_AMBULATORY_CARE_PROVIDER_SITE_OTHER): Payer: Self-pay | Admitting: Family Medicine

## 2018-07-11 MED ORDER — TIZANIDINE HCL 2 MG PO TABS: 2.0000 mg | ORAL_TABLET | Freq: Every evening | ORAL | 1 refills | Status: DC | PRN

## 2018-07-11 NOTE — Telephone Encounter (Signed)
Yes ok to fill

## 2018-07-11 NOTE — Telephone Encounter (Signed)
Pt called in said she called in Friday asking about her referral to physical therapy but nobody told her which one it was, says she was waiting to hear back from someone Friday but never did. Also would like a refill on Tizanidine 2 MG says due to her increasing the dosage to 4 times a day it cause the medication to run out and pharmacy wont refill it without an approval.  [phone]

## 2018-07-11 NOTE — Telephone Encounter (Signed)
I spoke with the patient. Refill of Tizanidine has been sent in to [facility]. I gave her the number to [facility]. She will call them to schedule an appointment. She will let me know if she can not get in soon - she would like to go to [facility] if they cannot get her in.

## 2018-07-11 NOTE — Telephone Encounter (Signed)
Patient has called again this a.m to inquire about a call back as well as refill and therapy order.  Please call patient back asap. [phone]

## 2018-07-11 NOTE — Telephone Encounter (Signed)
The patient called back to let me know she could not get in at [facility] until next week.  She asked that we send a referral to [facility]) because they could see her sooner. Brynn faxed the referral over - the patient will call them to schedule an appointment.

## 2018-07-11 NOTE — Telephone Encounter (Signed)
I called the patient - see other message on this from today. 

## 2018-07-11 NOTE — Telephone Encounter (Signed)
Would you be willing to refill this patient's Tizanidine, in Dr. Prince Rome' absence. She is taking 2 mg qid prn for low back pain - she does have a bulging disc.

## 2018-07-18 ENCOUNTER — Ambulatory Visit: Payer: Medicare Other

## 2018-10-15 ENCOUNTER — Other Ambulatory Visit: Payer: Self-pay | Admitting: Family Medicine

## 2018-10-15 DIAGNOSIS — Z20822 Contact with and (suspected) exposure to covid-19: Secondary | ICD-10-CM

## 2018-10-17 LAB — NOVEL CORONAVIRUS, NAA: SARS-CoV-2, NAA: NOT DETECTED

## 2019-03-30 ENCOUNTER — Other Ambulatory Visit: Payer: Self-pay

## 2019-03-30 DIAGNOSIS — Z20822 Contact with and (suspected) exposure to covid-19: Secondary | ICD-10-CM

## 2019-04-03 LAB — NOVEL CORONAVIRUS, NAA: SARS-CoV-2, NAA: NOT DETECTED

## 2019-11-07 ENCOUNTER — Other Ambulatory Visit: Payer: Self-pay | Admitting: Family Medicine

## 2019-11-07 DIAGNOSIS — Z1231 Encounter for screening mammogram for malignant neoplasm of breast: Secondary | ICD-10-CM

## 2019-11-10 ENCOUNTER — Ambulatory Visit
Admission: RE | Admit: 2019-11-10 | Discharge: 2019-11-10 | Disposition: A | Discharge status: Home / Self Care | Payer: Medicare Other | Source: Ambulatory Visit

## 2019-11-10 ENCOUNTER — Other Ambulatory Visit: Payer: Self-pay

## 2019-11-10 DIAGNOSIS — Z1231 Encounter for screening mammogram for malignant neoplasm of breast: Secondary | ICD-10-CM

## 2020-01-24 ENCOUNTER — Other Ambulatory Visit: Payer: Self-pay

## 2020-01-24 ENCOUNTER — Encounter: Payer: Self-pay | Admitting: Family Medicine

## 2020-01-24 ENCOUNTER — Ambulatory Visit (INDEPENDENT_AMBULATORY_CARE_PROVIDER_SITE_OTHER): Payer: Medicare Other | Admitting: Family Medicine

## 2020-01-24 DIAGNOSIS — M79652 Pain in left thigh: Secondary | ICD-10-CM | POA: Diagnosis not present

## 2020-01-24 NOTE — Progress Notes (Signed)
Office Visit Note   Patient: Virginia Becker           Date of Birth: April 09, 1946           MRN: [id number] Visit Date: 01/24/2020 Requested by: Mila Palmer, MD [address] [place],  [place] [address] PCP: Mila Palmer, MD  Subjective: Chief Complaint  Patient presents with  . Left Thigh - Pain    Pain in the distal thigh, "in the muscles." Been going on for 4-6 weeks. NKI. Her left knee has been "weak" so she has been putting more weight on the right leg.    HPI:  74yo F presenting to clinic with L leg pain x4-6weeks. Patient states she had similar symptoms approximately one year ago, when she was diagnosed with radiculopathy vs meralgia paresthetica and was sent to physical therapy. Patient states she did 2 months of physical therapy, with marked improvement. She had several months of being completely symptoms free before her recent return of pain. Previous pain radiating from her lower back to the top of her knee, but her current pain is focused primarily on the anterior aspect of her thigh, just proximal to her knee. She describes her pain as 'a burning,' and 'it just feels weird.' Denies any trauma to her knee or her back. No worsening of her back pain. Since her pain returned, she has tried resuming her previously prescribed physical therapy exercises, without much improvement.  She does admit to wearing compressive Spanks on a near daily basis, and jeans when she is not in these spanks. She feels as though her right leg is slightly weaker than the left, though not markedly so. No numbness distally in the leg. No bowel/bladder dysfunction.               ROS:   All other systems were reviewed and are negative.  Objective: Vital Signs: There were no vitals taken for this visit.  Physical Exam:  General:  Alert and oriented, in no acute distress. Pulm:  Breathing unlabored. Psy:  Normal mood, congruent affect. Skin:  No bruises or rashes appreciated.     Left Leg:  Normal gait. No obvious swelling or deformity.  Full ROM at knee and hip, with no pain with internal or external hip rotation.  No Tenderness to palpation along the lumbar paraspinal muscles, no spinal midline tenderness, and no SI Joint tenderness bilaterally.  Mild tenderness to palpation within the distal quadricepts musculature. No defects appreciated. Full strength with knee and hip flexion.  Decreased sensation to light touch across anterior aspect of distal thigh.  5/5 strength with hip flexion/extension, adduction/abduction, and knee flexion/extension, as well as ankle dorsi/plantarflexion and great toe extension.  2+patellar and achilles reflex bilaterally  Imaging: No results found.  Assessment & Plan: 74yo F presenting to clinic with recurrence of lateral left leg pain, decreased sensation. Examination as above, which was concerning for isolated sensory deficit over the anterior aspect of her distal thigh.  -Given patient's admission of frequently wearing compressive garments, suspect this has triggered recurrence of meralgia paresthetica, esp as her current symptoms are not associated with worsening of back pain. -Instructed to avoid all compressive garments for 1-2 weeks, to see if this improves her pain -Will send back to physical therapy, given her significant improvement with this in the past.  -If she fails to have similar improvement with PT and avoidance of compressive clothing, would consider MRI to r/o true lumbar radiculopathy, vs cortisone injection.  -  Patient is agreeable with plan, and has no further questions or concerns.      Procedures: No procedures performed  No notes on file     PMFS History: Patient Active Problem List   Diagnosis Date Noted  . Chest pain 12/15/2017  . Benign essential HTN 12/15/2017  . Depression 12/15/2017  . Insomnia 12/15/2017  . Hyperlipidemia 12/15/2017  . Abdominal pain 07/23/2013  . Diverticulitis of intestine  with abscess without bleeding 07/18/2013  . Hypothyroid 07/18/2013  . Leukocytosis 07/18/2013   Past Medical History:  Diagnosis Date  . Depression   . Hyperlipidemia   . Hypertension   . Thyroid disease     Family History  Problem Relation Age of Onset  . Cancer Mother        pancreatic  . Mental illness Mother   . Heart disease Father   . Mental illness Father   . Hypertension Sister   . Mental illness Sister   . Hypertension Brother   . Breast cancer Maternal Aunt   . Breast cancer Maternal Aunt     Past Surgical History:  Procedure Laterality Date  . ABDOMINAL HYSTERECTOMY    . bone spur    . CORNEAL TRANSPLANT    . EYE SURGERY    . THYROID SURGERY    . TONSILLECTOMY     Social History   Occupational History  . Not on file  Tobacco Use  . Smoking status: Never Smoker  . Smokeless tobacco: Never Used  Substance and Sexual Activity  . Alcohol use: No  . Drug use: No  . Sexual activity: Yes    Comment: post menopausal

## 2020-01-24 NOTE — Progress Notes (Signed)
I saw and examined the patient with Dr. Marga Hoots and agree with assessment and plan as outlined.    Recurrent left thigh pain.  Symptoms suggest meralgia paresthetica.  Previously did well with PT.  Will try it again, and she'll try to avoid tight clothing.    If pain persists, will either order MRI lumbar spine, or do nerve block under ultrasound.

## 2020-02-06 ENCOUNTER — Other Ambulatory Visit: Payer: Self-pay

## 2020-02-06 ENCOUNTER — Encounter: Payer: Self-pay | Admitting: Rehabilitative and Restorative Service Providers"

## 2020-02-06 ENCOUNTER — Ambulatory Visit: Payer: Medicare Other | Admitting: Rehabilitative and Restorative Service Providers"

## 2020-02-06 DIAGNOSIS — M79605 Pain in left leg: Secondary | ICD-10-CM

## 2020-02-06 DIAGNOSIS — M5442 Lumbago with sciatica, left side: Secondary | ICD-10-CM

## 2020-02-06 NOTE — Patient Instructions (Signed)
Access Code: [email or web address] URL: [email or web address] Date: 02/06/2020 Prepared by: Chyrel Masson  Exercises Supine Piriformis Stretch with Foot on Ground - 2 x daily - 7 x weekly - 5 sets - 30 reps Supine Piriformis Stretch - 2 x daily - 7 x weekly - 5 reps - 1 sets - 30 hold Supine Lower Trunk Rotation - 2 x daily - 7 x weekly - 5 reps - 1 sets - 15 hold Standing Lumbar Extension - 2 x daily - 7 x weekly - 10 reps - 2-3 sets Hip Flexor Stretch at Edge of Bed - 2 x daily - 7 x weekly - 1 sets - 5 reps - 30 hold

## 2020-02-06 NOTE — Therapy (Signed)
[facility] Physical Therapy [address] [place], [place], [address] Phone: [phone]   Fax:  [phone]  Physical Therapy Evaluation  Patient Details  Name: Virginia Becker MRN: [id number] Date of Birth: 12/02/1945 Referring Provider (PT): Dr. Prince Rome   Encounter Date: 02/06/2020   PT End of Session - 02/06/20 1507    Visit Number 1    Number of Visits 12    Date for PT Re-Evaluation 04/02/20    Progress Note Due on Visit 10    PT Start Time 1508    PT Stop Time 1538    PT Time Calculation (min) 30 min    Activity Tolerance Patient tolerated treatment well    Behavior During Therapy [facility] for tasks assessed/performed           Past Medical History:  Diagnosis Date   Depression    Hyperlipidemia    Hypertension    Thyroid disease     Past Surgical History:  Procedure Laterality Date   ABDOMINAL HYSTERECTOMY     bone spur     CORNEAL TRANSPLANT     EYE SURGERY     THYROID SURGERY     TONSILLECTOMY      There were no vitals filed for this visit.    Subjective Assessment - 02/06/20 1508    Subjective Chief complaint of Lt thigh pain per referral.  Pt. instances of symptoms in past perhaps.  Pt. stated having symptoms in anterior/lateral Lt thigh.  Pt. stated she is able to do her normal things but with discomfort.  Symptoms indicated from posterior hip into leg.   Pt. also indicated feeling Rt leg is a little weak as well.  Pt. stated worsened symptoms Sept.  Pt. indicated previous times improved c some therapy treatment in past c stretching.  Pt. indicated trying those again but inconsistently.    Pertinent History HTN, hyperlipidemia    Limitations Sitting    Diagnostic tests no testing reported    Patient Stated Goals Reduce pain    Currently in Pain? Yes    Pain Score 4    pain at worst 4/10   Pain Location Leg    Pain Orientation Left    Pain Descriptors / Indicators Pressure;Dull;Aching   pressure   Pain Radiating  Towards Lt thigh    Pain Onset 1 to 4 weeks ago    Pain Frequency Intermittent    Aggravating Factors  prolonged sitting, prolonged standing    Pain Relieving Factors some stretching at times, not sitting    Effect of Pain on Daily Activities Sitting limited, prolonged standing              [facility] PT Assessment - 02/06/20 0001      Assessment   Medical Diagnosis Lt Thigh Pain    Referring Provider (PT) Dr. Prince Rome    Onset Date/Surgical Date 01/10/20    Prior Therapy In past for similar condition      Precautions   Precautions None      Restrictions   Weight Bearing Restrictions No      Balance Screen   Has the patient fallen in the past 6 months No    Is the patient reluctant to leave their home because of a fear of falling?  No      Home Environment   Living Environment Private residence    Additional Comments 2 steps to enter one level house      Prior Function   Level of Independence  Independent    Vocation Requirements Work in office (sitting)    Leisure Crafts (sitting)      Cognition   Overall Cognitive Status Within Functional Limits for tasks assessed      Observation/Other Assessments   Observations Unremarkable pelvic alignment in standing      Sensation   Light Touch Appears Intact      Functional Tests   Functional tests Sit to Stand;Single leg stance      Sit to Stand   Comments 18 inch chair s UE assist      ROM / Strength   AROM / PROM / Strength AROM;PROM;Strength      AROM   Overall AROM Comments No complaints c active hip movements, equal bilateral    AROM Assessment Site Lumbar;Hip    Right/Left Hip Left;Right    Lumbar Flexion to ankles, no worse, stretch in back    Lumbar Extension 75 % WFL, REIS x 5 c back loosening indicated    Lumbar - Right Side Bend to knee jt     Lumbar - Left Side Bend to knee jt       PROM   PROM Assessment Site Hip    Right/Left Hip Left;Right      Strength   Strength Assessment Site Ankle;Knee;Hip     Right/Left Hip Left;Right    Right Hip Flexion 5/5    Left Hip Flexion 5/5    Right/Left Knee Left;Right    Right Knee Flexion 5/5    Right Knee Extension 5/5    Left Knee Flexion 5/5    Left Knee Extension 5/5    Right/Left Ankle Left;Right    Right Ankle Dorsiflexion 5/5    Left Ankle Dorsiflexion 5/5      Flexibility   Soft Tissue Assessment /Muscle Length yes    Hamstrings passive slr bilateral 80 deg no complaints    Quadriceps (+) Thomas for rectus on Lt      Special Tests   Other special tests (-)slump bilateral                      Objective measurements completed on examination: See above findings.       [facility] Adult PT Treatment/Exercise - 02/06/20 0001      Exercises   Exercises Other Exercises;Knee/Hip    Other Exercises  HEP instruction/performance c cues and handout.  Additional time spent for education on HEP      Knee/Hip Exercises: Stretches   Other Knee/Hip Stretches supine LTR stretch 15 sec x 5 bilateral, SK to opposite shoulder 30 sec x 5 Lt, piriformis figure 4 30 sec x 5 Lt, standing lumbar ext x 10                  PT Education - 02/06/20 1508    Education Details HEP, POC    Person(s) Educated Patient    Methods Explanation;Demonstration;Verbal cues;Handout    Comprehension Verbalized understanding;Returned demonstration            PT Short Term Goals - 02/06/20 1507      PT SHORT TERM GOAL #1   Title Patient will demonstrate independent use of home exercise program to maintain progress from in clinic treatments.    Time 3    Period Weeks    Status New    Target Date 02/27/20             PT Long Term Goals - 02/06/20 1505  PT LONG TERM GOAL #1   Title Patient will demonstrate/report pain at worst less than or equal to 2/10 to facilitate minimal limitation in daily activity secondary to pain symptoms.    Time 8    Period Weeks    Status New    Target Date 04/02/20      PT LONG TERM GOAL #2   Title  Patient will demonstrate independent use of home exercise program to facilitate ability to maintain/progress functional gains from skilled physical therapy services.    Time 8    Period Weeks    Status New    Target Date 04/02/20      PT LONG TERM GOAL #3   Title Pt. will demonstrate lumbar ext 100 % WFL s symptoms to facilitate upright standing posture at PLOF s limitation.    Time 8    Period Weeks    Status New    Target Date 04/02/20      PT LONG TERM GOAL #4   Title Pt. will demonstrate Lt hip myofascial mobility (thomas test, piriformis) equal to Rt to facilitate usual mobility at PLOF.    Time 8    Period Weeks    Status New    Target Date 04/02/20      PT LONG TERM GOAL #5   Title Pt. will demonstrate/report ability to perform work at [employer] s limitation.    Time 8    Period Weeks    Status New    Target Date 04/02/20                  Plan - 02/06/20 1506    Clinical Impression Statement Patient is a 74 y.o. female who comes to clinic with complaints of low back pain, Lt thigh pain with mobility deficits that impair their ability to perform usual daily and recreational functional activities without increase difficulty/symptoms at this time.  Patient to benefit from skilled PT services to address impairments and limitations to improve to previous level of function without restriction secondary to condition.    Personal Factors and Comorbidities Comorbidity 3+    Comorbidities HTN, lipidemia, depression    Examination-Activity Limitations Sit;Stand;Stairs    Examination-Participation Restrictions Occupation;Driving;Community Activity    Stability/Clinical Decision Making Stable/Uncomplicated    Clinical Decision Making Low    Rehab Potential Good    PT Frequency --   1-2x/week   PT Duration 8 weeks    PT Treatment/Interventions ADLs/Self Care Home Management;Electrical Stimulation;Iontophoresis 4mg /ml Dexamethasone;Moist Heat;Balance training;Therapeutic  exercise;Therapeutic activities;Functional mobility training;Stair training;Gait training;Ultrasound;Neuromuscular re-education;Patient/family education;Manual techniques;Passive range of motion;Dry needling;Spinal Manipulations;Joint Manipulations    PT Next Visit Plan Reassess HEP, promote lumbar and hip mobility gains myofascial    PT Home Exercise Plan [id number]    Consulted and Agree with Plan of Care Patient           Patient will benefit from skilled therapeutic intervention in order to improve the following deficits and impairments:  Hypomobility, Increased fascial restricitons, Pain, Decreased mobility, Decreased range of motion, Impaired perceived functional ability, Impaired flexibility  Visit Diagnosis: Pain in left leg  Acute bilateral low back pain with left-sided sciatica     Problem List Patient Active Problem List   Diagnosis Date Noted   Chest pain 12/15/2017   Benign essential HTN 12/15/2017   Depression 12/15/2017   Insomnia 12/15/2017   Hyperlipidemia 12/15/2017   Abdominal pain 07/23/2013   Diverticulitis of intestine with abscess without bleeding 07/18/2013   Hypothyroid 07/18/2013   Leukocytosis  07/18/2013    Chyrel Masson, PT, DPT, OCS, ATC 02/06/20  3:43 PM    [facility] Physical Therapy [address] [place], [place], [address] Phone: [phone]   Fax:  [phone]  Name: Jeannene Tschetter MRN: [id number] Date of Birth: [birth year]

## 2020-02-15 ENCOUNTER — Other Ambulatory Visit: Payer: Self-pay

## 2020-02-15 ENCOUNTER — Ambulatory Visit: Payer: Medicare Other | Admitting: Rehabilitative and Restorative Service Providers"

## 2020-02-15 ENCOUNTER — Encounter: Payer: Self-pay | Admitting: Rehabilitative and Restorative Service Providers"

## 2020-02-15 DIAGNOSIS — M5442 Lumbago with sciatica, left side: Secondary | ICD-10-CM | POA: Diagnosis not present

## 2020-02-15 DIAGNOSIS — M79605 Pain in left leg: Secondary | ICD-10-CM

## 2020-02-15 NOTE — Therapy (Signed)
[facility] Physical Therapy [address] [place], [place], [address] Phone: [phone]   Fax:  [phone]  Physical Therapy Treatment  Patient Details  Name: Virginia Becker MRN: [id number] Date of Birth: [birth year] Referring Provider (PT): Dr. Prince Rome   Encounter Date: 02/15/2020   PT End of Session - 02/15/20 1315    Visit Number 2    Number of Visits 12    Date for PT Re-Evaluation 04/02/20    Progress Note Due on Visit 10    PT Start Time 1302    PT Stop Time 1313    PT Time Calculation (min) 11 min    Activity Tolerance Patient tolerated treatment well    Behavior During Therapy [facility] for tasks assessed/performed           Past Medical History:  Diagnosis Date   Depression    Hyperlipidemia    Hypertension    Thyroid disease     Past Surgical History:  Procedure Laterality Date   ABDOMINAL HYSTERECTOMY     bone spur     CORNEAL TRANSPLANT     EYE SURGERY     THYROID SURGERY     TONSILLECTOMY      There were no vitals filed for this visit.   Subjective Assessment - 02/15/20 1304    Subjective Pt. indicated feeling having improvements and was able to improve things c movements HEP.    Pertinent History HTN, hyperlipidemia    Limitations Sitting    Diagnostic tests no testing reported    Patient Stated Goals Reduce pain    Currently in Pain? No/denies    Pain Score 0-No pain    Pain Location Leg    Pain Orientation Left    Pain Onset 1 to 4 weeks ago    Pain Frequency Occasional                             [facility] Adult PT Treatment/Exercise - 02/15/20 0001      Exercises   Other Exercises  Review of HEP techniques, education on continued use and progression with transition to HEP.        Knee/Hip Exercises: Stretches   Other Knee/Hip Stretches seated piriformis stretch demo 15 sec x 2 on Lt (figure 4)                  PT Education - 02/15/20 1315    Education Details HEP review,  adaptive techniques for sitting, POC for HEP/ dc    Person(s) Educated Patient    Methods Explanation;Demonstration    Comprehension Returned demonstration;Verbalized understanding            PT Short Term Goals - 02/15/20 1313      PT SHORT TERM GOAL #1   Title Patient will demonstrate independent use of home exercise program to maintain progress from in clinic treatments.    Time 3    Period Weeks    Status Achieved    Target Date 02/27/20             PT Long Term Goals - 02/06/20 1505      PT LONG TERM GOAL #1   Title Patient will demonstrate/report pain at worst less than or equal to 2/10 to facilitate minimal limitation in daily activity secondary to pain symptoms.    Time 8    Period Weeks    Status New    Target Date 04/02/20  PT LONG TERM GOAL #2   Title Patient will demonstrate independent use of home exercise program to facilitate ability to maintain/progress functional gains from skilled physical therapy services.    Time 8    Period Weeks    Status New    Target Date 04/02/20      PT LONG TERM GOAL #3   Title Pt. will demonstrate lumbar ext 100 % WFL s symptoms to facilitate upright standing posture at PLOF s limitation.    Time 8    Period Weeks    Status New    Target Date 04/02/20      PT LONG TERM GOAL #4   Title Pt. will demonstrate Lt hip myofascial mobility (thomas test, piriformis) equal to Rt to facilitate usual mobility at PLOF.    Time 8    Period Weeks    Status New    Target Date 04/02/20      PT LONG TERM GOAL #5   Title Pt. will demonstrate/report ability to perform work at [employer] s limitation.    Time 8    Period Weeks    Status New    Target Date 04/02/20                 Plan - 02/15/20 1314    Clinical Impression Statement Pt. returned to clinic today reporting reduced complaints and improved management c HEP use.  Good knowledge of HEP at this time. Treatment shortened due to good knowledge and no symptoms today.      Personal Factors and Comorbidities Comorbidity 3+    Comorbidities HTN, lipidemia, depression    Examination-Activity Limitations Sit;Stand;Stairs    Examination-Participation Restrictions Occupation;Driving;Community Activity    Stability/Clinical Decision Making Stable/Uncomplicated    Rehab Potential Good    PT Frequency --   1-2x/week   PT Duration 8 weeks    PT Treatment/Interventions ADLs/Self Care Home Management;Electrical Stimulation;Iontophoresis 4mg /ml Dexamethasone;Moist Heat;Balance training;Therapeutic exercise;Therapeutic activities;Functional mobility training;Stair training;Gait training;Ultrasound;Neuromuscular re-education;Patient/family education;Manual techniques;Passive range of motion;Dry needling;Spinal Manipulations;Joint Manipulations    PT Next Visit Plan On hold for 3-4 weeks, return if necessary based off symptom presentation.    PT Home Exercise Plan    Consulted and Agree with Plan of Care Patient           Patient will benefit from skilled therapeutic intervention in order to improve the following deficits and impairments:  Hypomobility, Increased fascial restricitons, Pain, Decreased mobility, Decreased range of motion, Impaired perceived functional ability, Impaired flexibility  Visit Diagnosis: Pain in left leg  Acute bilateral low back pain with left-sided sciatica     Problem List Patient Active Problem List   Diagnosis Date Noted   Chest pain 12/15/2017   Benign essential HTN 12/15/2017   Depression 12/15/2017   Insomnia 12/15/2017   Hyperlipidemia 12/15/2017   Abdominal pain 07/23/2013   Diverticulitis of intestine with abscess without bleeding 07/18/2013   Hypothyroid 07/18/2013   Leukocytosis 07/18/2013   09/17/2013, PT, DPT, OCS, ATC 02/15/20  1:16 PM    [facility] Physical Therapy [address] [place], [place], [place] Phone: [phone]   Fax:  [phone]  Name: Virginia Becker MRN: Carmela Hurt Date of Birth: [birth year]

## 2020-02-26 ENCOUNTER — Encounter: Payer: Medicare Other | Admitting: Rehabilitative and Restorative Service Providers"

## 2020-03-04 ENCOUNTER — Encounter: Payer: Medicare Other | Admitting: Rehabilitative and Restorative Service Providers"

## 2020-03-12 ENCOUNTER — Encounter: Payer: Self-pay | Admitting: Rehabilitative and Restorative Service Providers"

## 2020-03-12 ENCOUNTER — Other Ambulatory Visit: Payer: Self-pay

## 2020-03-12 ENCOUNTER — Ambulatory Visit: Payer: Medicare Other | Admitting: Rehabilitative and Restorative Service Providers"

## 2020-03-12 DIAGNOSIS — M5442 Lumbago with sciatica, left side: Secondary | ICD-10-CM

## 2020-03-12 DIAGNOSIS — M79605 Pain in left leg: Secondary | ICD-10-CM | POA: Diagnosis not present

## 2020-03-12 NOTE — Therapy (Signed)
[facility] [address], [place], [address] Phone: [phone]   Fax:  [phone]  Physical Therapy Treatment/Discharge  Patient Details  Name: Virginia Becker MRN: [id number] Date of Birth: [birth year] Referring Provider (PT): Dr. Junius Roads   Encounter Date: 03/12/2020   PT End of Session - 03/12/20 1406    Visit Number 3    Number of Visits 12    Date for PT Re-Evaluation 04/02/20    Progress Note Due on Visit 10    PT Start Time 1344    PT Stop Time 1407    PT Time Calculation (min) 23 min    Activity Tolerance Patient tolerated treatment well    Behavior During Therapy [facility] for tasks assessed/performed           Past Medical History:  Diagnosis Date  . Depression   . Hyperlipidemia   . Hypertension   . Thyroid disease     Past Surgical History:  Procedure Laterality Date  . ABDOMINAL HYSTERECTOMY    . bone spur    . CORNEAL TRANSPLANT    . EYE SURGERY    . THYROID SURGERY    . TONSILLECTOMY      There were no vitals filed for this visit.   Subjective Assessment - 03/12/20 1349    Subjective Pt. stated overall doing well.  Pt. stated prolonged sitting/driving sometimes can create some complaints but overall feels like moving around and doing HEP does help.    Pertinent History HTN, hyperlipidemia    Limitations Sitting    Diagnostic tests no testing reported    Patient Stated Goals Reduce pain    Currently in Pain? No/denies    Pain Score 0-No pain    Pain Onset 1 to 4 weeks ago              [facility] PT Assessment - 03/12/20 0001      Assessment   Medical Diagnosis Lt Thigh Pain    Referring Provider (PT) Dr. Junius Roads    Onset Date/Surgical Date 01/10/20      AROM   Lumbar Flexion to feet, no complaints    Lumbar Extension 100 %, no complaints    Lumbar - Right Side Bend to knee jt     Lumbar - Left Side Bend to knee jt                          [facility] Adult PT Treatment/Exercise -  03/12/20 0001      Self-Care   Self-Care Other Self-Care Comments    Other Self-Care Comments  Self care education regarding importance of use of HEP for self management of symptoms upon d/c.  Cues given and advise on routine, frequency of performance.       Knee/Hip Exercises: Stretches   Other Knee/Hip Stretches supine thomas stretch 30 sec Lt and Rt c cues    Other Knee/Hip Stretches passive SLR 30 sec x 5 bilateral      Knee/Hip Exercises: Standing   Other Standing Knee Exercises lumbar extension x 3, flexion x 2, lateral flexion x 1 each way                    PT Short Term Goals - 02/15/20 1313      PT SHORT TERM GOAL #1   Title Patient will demonstrate independent use of home exercise program to maintain progress from in clinic treatments.    Time 3  Period Weeks    Status Achieved    Target Date 02/27/20             PT Long Term Goals - 03/12/20 1405      PT LONG TERM GOAL #1   Title Patient will demonstrate/report pain at worst less than or equal to 2/10 to facilitate minimal limitation in daily activity secondary to pain symptoms.    Time 8    Period Weeks    Status Achieved      PT LONG TERM GOAL #2   Title Patient will demonstrate independent use of home exercise program to facilitate ability to maintain/progress functional gains from skilled physical therapy services.    Time 8    Period Weeks    Status Achieved      PT LONG TERM GOAL #3   Title Pt. will demonstrate lumbar ext 100 % WFL s symptoms to facilitate upright standing posture at PLOF s limitation.    Time 8    Period Weeks    Status Achieved      PT LONG TERM GOAL #4   Title Pt. will demonstrate Lt hip myofascial mobility (thomas test, piriformis) equal to Rt to facilitate usual mobility at PLOF.    Time 8    Period Weeks    Status Achieved      PT LONG TERM GOAL #5   Title Pt. will demonstrate/report ability to perform work at [employer] s limitation.    Time 8    Period Weeks      Status Achieved                 Plan - 03/12/20 1405    Clinical Impression Statement Pt. has attended 3 visits during course of treatment.  Pt. reported minimal pain complaints, good HEP knowledge at this time and demonstrate reaching established goals as documented.  Appropriate for D/C to HEP.    Personal Factors and Comorbidities Comorbidity 3+    Comorbidities HTN, lipidemia, depression    Examination-Activity Limitations Sit;Stand;Stairs    Examination-Participation Restrictions Occupation;Driving;Community Activity    Stability/Clinical Decision Making Stable/Uncomplicated    Rehab Potential Good    PT Frequency --   1-2x/week   PT Duration 8 weeks    PT Treatment/Interventions ADLs/Self Care Home Management;Electrical Stimulation;Iontophoresis 75m/ml Dexamethasone;Moist Heat;Balance training;Therapeutic exercise;Therapeutic activities;Functional mobility training;Stair training;Gait training;Ultrasound;Neuromuscular re-education;Patient/family education;Manual techniques;Passive range of motion;Dry needling;Spinal Manipulations;Joint Manipulations    PT Next Visit Plan D/C to HEP    PT Home Exercise Plan [id number]   Consulted and Agree with Plan of Care Patient           Patient will benefit from skilled therapeutic intervention in order to improve the following deficits and impairments:  Hypomobility, Increased fascial restricitons, Pain, Decreased mobility, Decreased range of motion, Impaired perceived functional ability, Impaired flexibility  Visit Diagnosis: Pain in left leg  Acute bilateral low back pain with left-sided sciatica     Problem List Patient Active Problem List   Diagnosis Date Noted  . Chest pain 12/15/2017  . Benign essential HTN 12/15/2017  . Depression 12/15/2017  . Insomnia 12/15/2017  . Hyperlipidemia 12/15/2017  . Abdominal pain 07/23/2013  . Diverticulitis of intestine with abscess without bleeding 07/18/2013  . Hypothyroid  07/18/2013  . Leukocytosis 07/18/2013   PHYSICAL THERAPY DISCHARGE SUMMARY  Visits from Start of Care 3  Current functional level related to goals / functional outcomes: See note   Remaining deficits: See note   Education / Equipment:  HEP  Plan: Patient agrees to discharge.  Patient goals were met. Patient is being discharged due to being pleased with the current functional level.  ?????    Scot Jun, PT, DPT, OCS, ATC 03/12/20  2:08 PM     [facility] [address] [place], [place], [address] Phone: [phone]   Fax:  [phone]  Name: Virginia Becker MRN: [id number] Date of Birth: 13-Jan-1946

## 2020-07-16 DIAGNOSIS — E7800 Pure hypercholesterolemia, unspecified: Secondary | ICD-10-CM | POA: Diagnosis not present

## 2020-07-16 DIAGNOSIS — E039 Hypothyroidism, unspecified: Secondary | ICD-10-CM | POA: Diagnosis not present

## 2020-07-16 DIAGNOSIS — G479 Sleep disorder, unspecified: Secondary | ICD-10-CM | POA: Diagnosis not present

## 2020-07-16 DIAGNOSIS — R109 Unspecified abdominal pain: Secondary | ICD-10-CM | POA: Diagnosis not present

## 2020-08-05 DIAGNOSIS — E039 Hypothyroidism, unspecified: Secondary | ICD-10-CM | POA: Diagnosis not present

## 2020-08-05 DIAGNOSIS — N182 Chronic kidney disease, stage 2 (mild): Secondary | ICD-10-CM | POA: Diagnosis not present

## 2020-08-05 DIAGNOSIS — M5136 Other intervertebral disc degeneration, lumbar region: Secondary | ICD-10-CM | POA: Diagnosis not present

## 2020-08-05 DIAGNOSIS — I10 Essential (primary) hypertension: Secondary | ICD-10-CM | POA: Diagnosis not present

## 2020-08-05 DIAGNOSIS — E559 Vitamin D deficiency, unspecified: Secondary | ICD-10-CM | POA: Diagnosis not present

## 2020-08-05 DIAGNOSIS — Z800 Family history of malignant neoplasm of digestive organs: Secondary | ICD-10-CM | POA: Diagnosis not present

## 2020-08-05 DIAGNOSIS — E1122 Type 2 diabetes mellitus with diabetic chronic kidney disease: Secondary | ICD-10-CM | POA: Diagnosis not present

## 2020-08-05 DIAGNOSIS — Z0000 Encounter for general adult medical examination without abnormal findings: Secondary | ICD-10-CM | POA: Diagnosis not present

## 2020-08-05 DIAGNOSIS — I77819 Aortic ectasia, unspecified site: Secondary | ICD-10-CM | POA: Diagnosis not present

## 2020-08-05 DIAGNOSIS — Z79899 Other long term (current) drug therapy: Secondary | ICD-10-CM | POA: Diagnosis not present

## 2020-10-24 ENCOUNTER — Other Ambulatory Visit: Payer: Self-pay | Admitting: Family Medicine

## 2020-10-24 DIAGNOSIS — Z1231 Encounter for screening mammogram for malignant neoplasm of breast: Secondary | ICD-10-CM

## 2020-12-10 DIAGNOSIS — M545 Low back pain, unspecified: Secondary | ICD-10-CM | POA: Diagnosis not present

## 2020-12-19 ENCOUNTER — Other Ambulatory Visit: Payer: Self-pay

## 2020-12-19 ENCOUNTER — Ambulatory Visit
Admission: RE | Admit: 2020-12-19 | Discharge: 2020-12-19 | Disposition: A | Discharge status: Home / Self Care | Payer: Medicare Other | Source: Ambulatory Visit | Attending: Family Medicine | Admitting: Family Medicine

## 2020-12-19 DIAGNOSIS — Z1231 Encounter for screening mammogram for malignant neoplasm of breast: Secondary | ICD-10-CM | POA: Diagnosis not present

## 2021-04-22 DIAGNOSIS — I10 Essential (primary) hypertension: Secondary | ICD-10-CM | POA: Diagnosis not present

## 2021-04-22 DIAGNOSIS — G479 Sleep disorder, unspecified: Secondary | ICD-10-CM | POA: Diagnosis not present

## 2021-04-24 ENCOUNTER — Other Ambulatory Visit: Payer: Self-pay | Admitting: Family Medicine

## 2021-04-24 DIAGNOSIS — E2839 Other primary ovarian failure: Secondary | ICD-10-CM

## 2021-10-15 ENCOUNTER — Ambulatory Visit
Admission: RE | Admit: 2021-10-15 | Discharge: 2021-10-15 | Disposition: A | Discharge status: Home / Self Care | Payer: Medicare Other | Source: Ambulatory Visit | Attending: Family Medicine | Admitting: Family Medicine

## 2021-10-15 DIAGNOSIS — E2839 Other primary ovarian failure: Secondary | ICD-10-CM

## 2022-03-11 IMAGING — MG MM DIGITAL SCREENING BILAT W/ TOMO AND CAD
6 of 10 series · 6 of 30 positions shown · non-contrast
Comparison: Previous exam(s).

CLINICAL DATA: Screening.

EXAM:
DIGITAL SCREENING BILATERAL MAMMOGRAM WITH TOMOSYNTHESIS AND CAD
TECHNIQUE: Bilateral screening digital craniocaudal and mediolateral oblique
mammograms were obtained. Bilateral screening digital breast
tomosynthesis was performed. The images were evaluated with
computer-aided detection.

[R CC synth-2D]
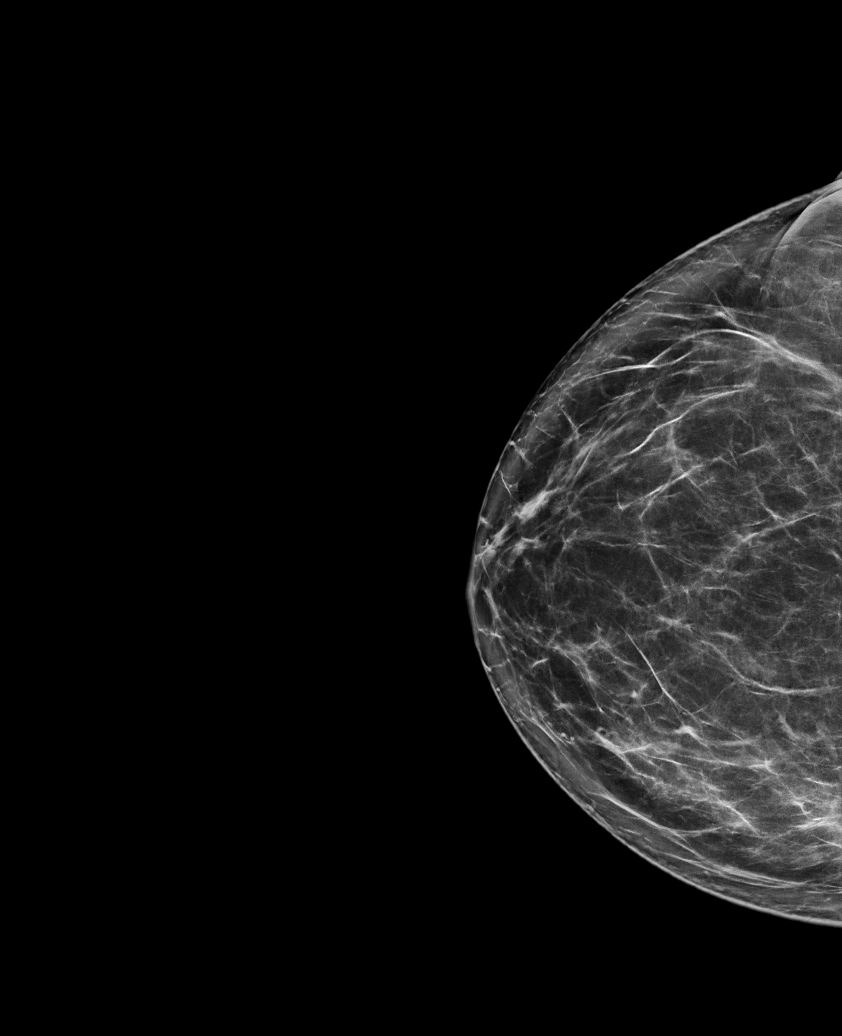

[L CC synth-2D (1 of 2)]
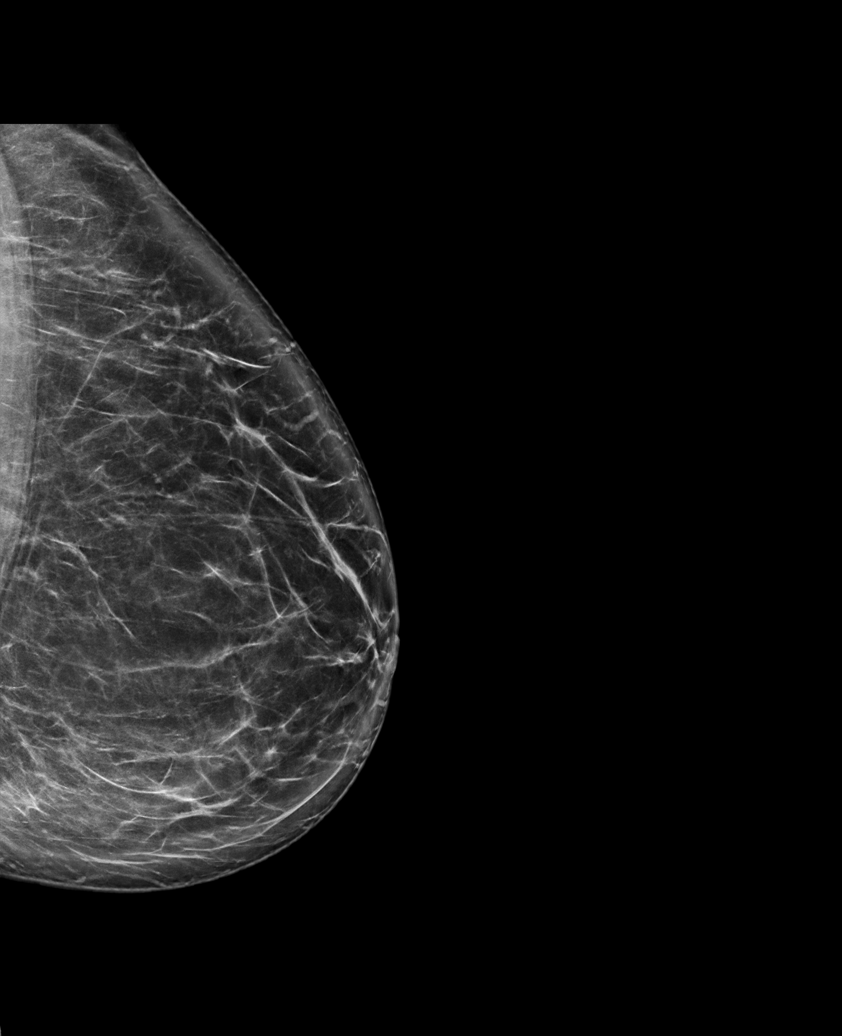

[L CC synth-2D (2 of 2)]
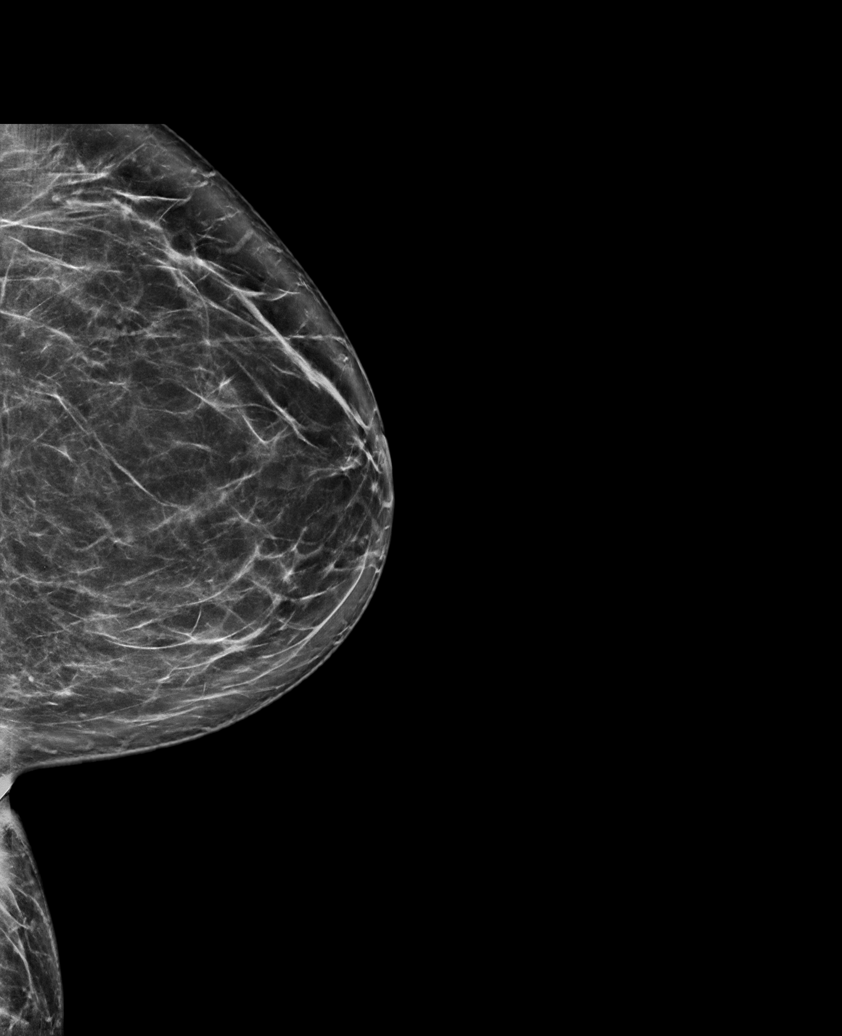

[R MLO synth-2D]
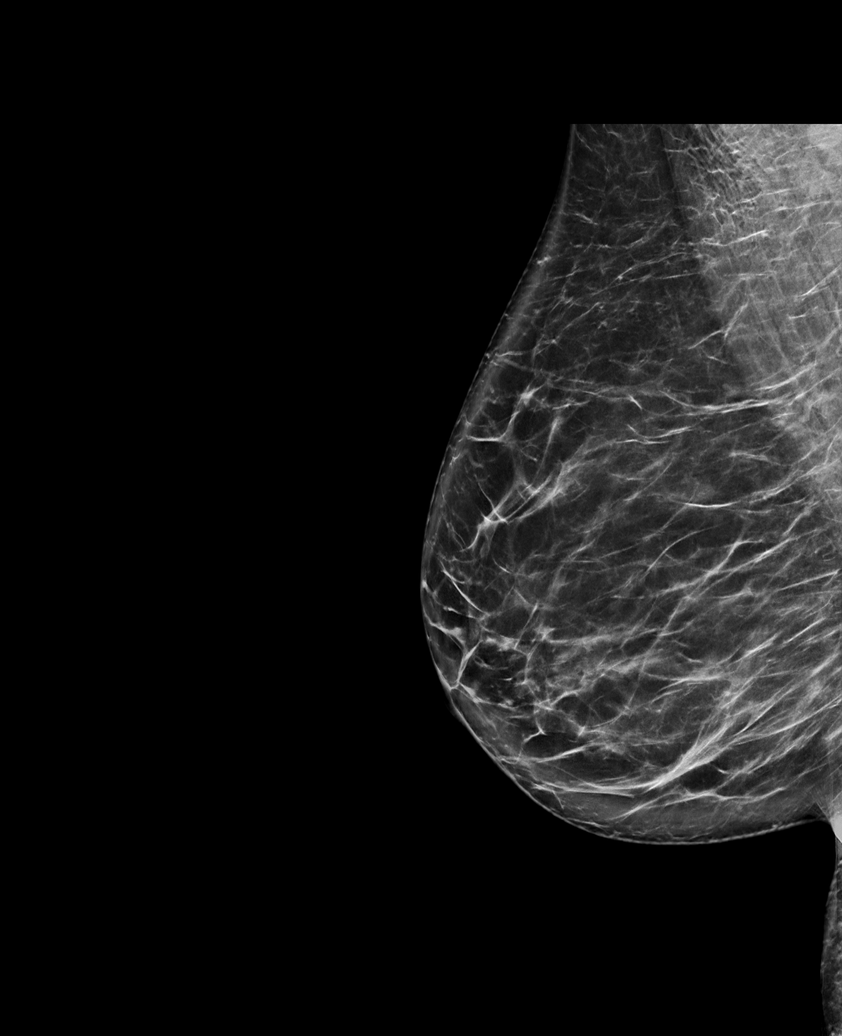

[L MLO synth-2D]
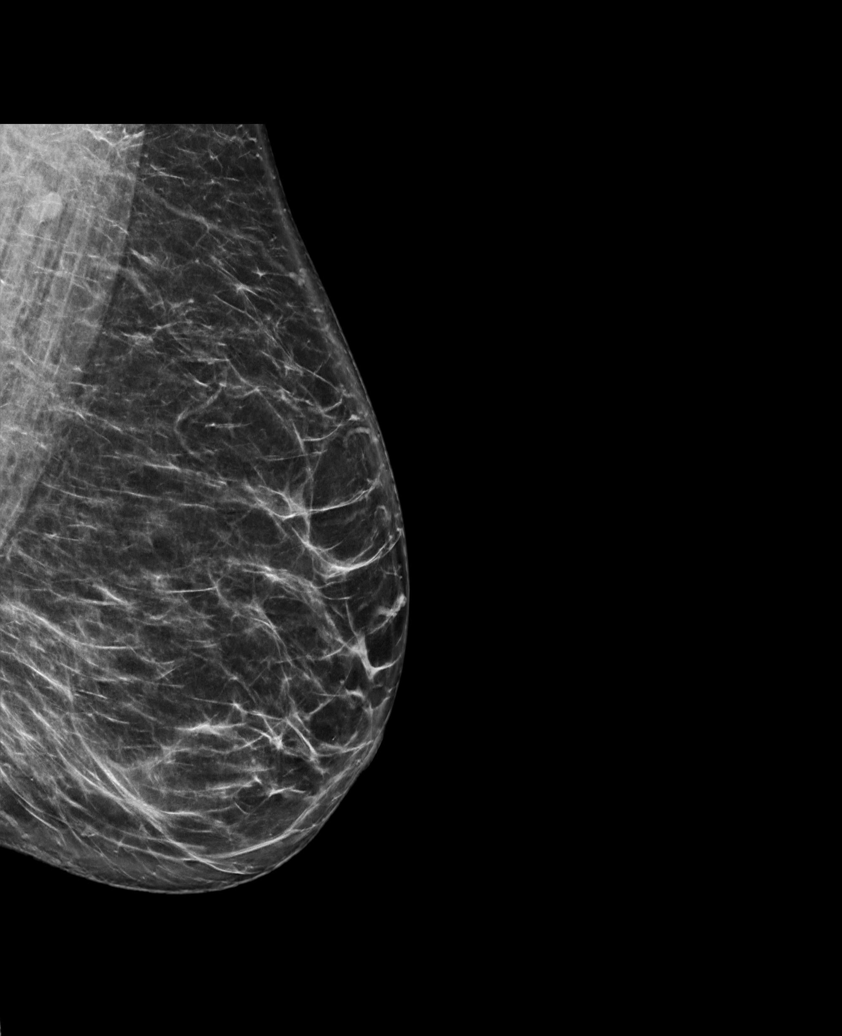

[R MLO tomo · tomo slice 46/91.0]
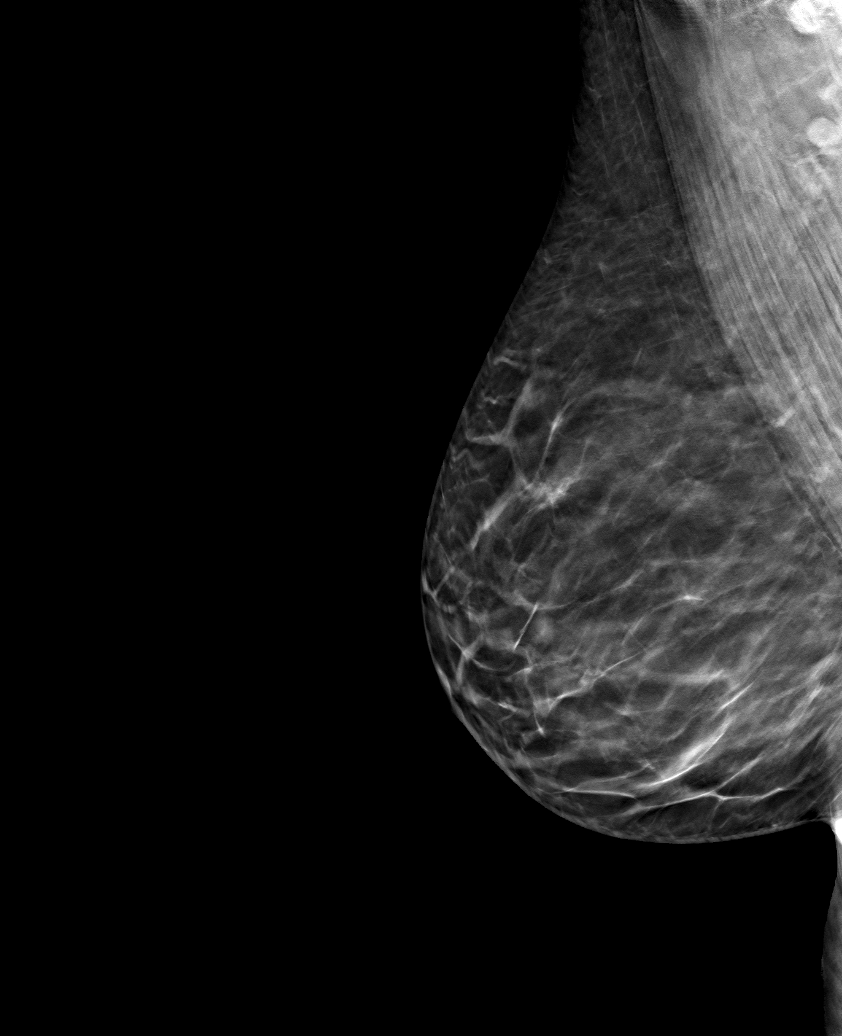

[6 of 30 positions shown; findings below may reference images not displayed]

ACR Breast Density Category b: There are scattered areas of
fibroglandular density.
FINDINGS: There are no findings suspicious for malignancy.
IMPRESSION: No mammographic evidence of malignancy. A result letter of this
screening mammogram will be mailed directly to the patient.

RECOMMENDATION:
Screening mammogram in one year. (Code:51-O-LD2)

BI-RADS CATEGORY  1: Negative.

## 2022-03-13 ENCOUNTER — Other Ambulatory Visit: Payer: Self-pay | Admitting: Family Medicine

## 2022-03-13 DIAGNOSIS — E039 Hypothyroidism, unspecified: Secondary | ICD-10-CM

## 2022-03-17 ENCOUNTER — Ambulatory Visit
Admission: RE | Admit: 2022-03-17 | Discharge: 2022-03-17 | Disposition: A | Discharge status: Home / Self Care | Payer: Medicare Other | Source: Ambulatory Visit | Attending: Family Medicine | Admitting: Family Medicine

## 2022-03-17 DIAGNOSIS — E039 Hypothyroidism, unspecified: Secondary | ICD-10-CM

## 2022-03-21 ENCOUNTER — Other Ambulatory Visit: Payer: Self-pay | Admitting: Family Medicine

## 2022-03-21 DIAGNOSIS — E042 Nontoxic multinodular goiter: Secondary | ICD-10-CM

## 2022-03-27 ENCOUNTER — Other Ambulatory Visit: Payer: Self-pay | Admitting: Family Medicine

## 2022-03-27 DIAGNOSIS — E042 Nontoxic multinodular goiter: Secondary | ICD-10-CM

## 2022-04-21 ENCOUNTER — Ambulatory Visit
Admission: RE | Admit: 2022-04-21 | Discharge: 2022-04-21 | Disposition: A | Discharge status: Home / Self Care | Payer: Medicare Other | Source: Ambulatory Visit | Attending: Family Medicine | Admitting: Family Medicine

## 2022-04-21 ENCOUNTER — Other Ambulatory Visit (HOSPITAL_COMMUNITY)
Admission: RE | Admit: 2022-04-21 | Discharge: 2022-04-21 | Disposition: A | Discharge status: Home / Self Care | Payer: Medicare Other | Source: Ambulatory Visit | Attending: Family Medicine | Admitting: Family Medicine

## 2022-04-21 DIAGNOSIS — E042 Nontoxic multinodular goiter: Secondary | ICD-10-CM

## 2022-04-24 LAB — CYTOLOGY - NON PAP

## 2022-05-06 ENCOUNTER — Other Ambulatory Visit: Payer: Medicare Other

## 2022-05-13 ENCOUNTER — Encounter (HOSPITAL_COMMUNITY): Payer: Self-pay

## 2022-10-29 DIAGNOSIS — D440 Neoplasm of uncertain behavior of thyroid gland: Secondary | ICD-10-CM | POA: Diagnosis not present

## 2022-10-29 DIAGNOSIS — E042 Nontoxic multinodular goiter: Secondary | ICD-10-CM | POA: Diagnosis not present

## 2022-11-04 NOTE — Patient Instructions (Signed)
SURGICAL WAITING ROOM VISITATION  Patients having surgery or a procedure may have no more than 2 support people in the waiting area - these visitors may rotate.    Children under the age of 29 must have an adult with them who is not the patient.  Due to an increase in RSV and influenza rates and associated hospitalizations, children ages 67 and under may not visit patients in [facility] hospitals.  If the patient needs to stay at the hospital during part of their recovery, the visitor guidelines for inpatient rooms apply. Pre-op nurse will coordinate an appropriate time for 1 support person to accompany patient in pre-op.  This support person may not rotate.    Please refer to the [facility] website for the visitor guidelines for Inpatients (after your surgery is over and you are in a regular room).       Your procedure is scheduled on: 11/16/22   Report to [facility] Main Entrance    Report to admitting at 10:45 AM   Call this number if you have problems the morning of surgery [phone]   Do not eat food :After Midnight.   After Midnight you may have the following liquids until ______ AM/ PM DAY OF SURGERY  Water Non-Citrus Juices (without pulp, NO RED-Apple, White grape, White cranberry) Black Coffee (NO MILK/CREAM OR CREAMERS, sugar ok)  Clear Tea (NO MILK/CREAM OR CREAMERS, sugar ok) regular and decaf                             Plain Jell-O (NO RED)                                           Fruit ices (not with fruit pulp, NO RED)                                     Popsicles (NO RED)                                                               Sports drinks like Gatorade (NO RED)              Drink 2 Ensure/G2 drinks AT 10:00 PM the night before surgery.        The day of surgery:  Drink ONE (1) Pre-Surgery Clear Ensure or G2 at AM the morning of surgery. Drink in one sitting. Do not sip.  This drink was given to you during your hospital  pre-op  appointment visit. Nothing else to drink after completing the  Pre-Surgery Clear Ensure or G2.          If you have questions, please contact your surgeon's office.   FOLLOW BOWEL PREP AND ANY ADDITIONAL PRE OP INSTRUCTIONS YOU RECEIVED FROM YOUR SURGEON'S OFFICE!!!     Oral Hygiene is also important to reduce your risk of infection.  Remember - BRUSH YOUR TEETH THE MORNING OF SURGERY WITH YOUR REGULAR TOOTHPASTE  DENTURES WILL BE REMOVED PRIOR TO SURGERY PLEASE DO NOT APPLY "Poly grip" OR ADHESIVES!!!   Do NOT smoke after Midnight   Take these medicines the morning of surgery with A SIP OF WATER: Citaprolam, Levothyroxine  DO NOT TAKE ANY ORAL DIABETIC MEDICATIONS DAY OF YOUR SURGERY             You may not have any metal on your body including hair pins, jewelry, and body piercing             Do not wear make-up, lotions, powders, perfumes/cologne, or deodorant  Do not wear nail polish including gel and S&S, artificial/acrylic nails, or any other type of covering on natural nails including finger and toenails. If you have artificial nails, gel coating, etc. that needs to be removed by a nail salon please have this removed prior to surgery or surgery may need to be canceled/ delayed if the surgeon/ anesthesia feels like they are unable to be safely monitored.   Do not shave  48 hours prior to surgery.    Do not bring valuables to the hospital. [facility] IS NOT             RESPONSIBLE   FOR VALUABLES.   Contacts, glasses, dentures or bridgework may not be worn into surgery.   Bring small overnight bag day of surgery.   DO NOT BRING YOUR HOME MEDICATIONS TO THE HOSPITAL. PHARMACY WILL DISPENSE MEDICATIONS LISTED ON YOUR MEDICATION LIST TO YOU DURING YOUR ADMISSION IN THE HOSPITAL!    Patients discharged on the day of surgery will not be allowed to drive home.  Someone NEEDS to stay with you for the first 24 hours after anesthesia.   Special  Instructions: Bring a copy of your healthcare power of attorney and living will documents the day of surgery if you haven't scanned them before.              Please read over the following fact sheets you were given: IF YOU HAVE QUESTIONS ABOUT YOUR PRE-OP INSTRUCTIONS PLEASE CALL [phone]   If you received a COVID test during your pre-op visit  it is requested that you wear a mask when out in public, stay away from anyone that may not be feeling well and notify your surgeon if you develop symptoms. If you test positive for Covid or have been in contact with anyone that has tested positive in the last 10 days please notify you surgeon.    [facility] - Preparing for Surgery Before surgery, you can play an important role.  Because skin is not sterile, your skin needs to be as free of germs as possible.  You can reduce the number of germs on your skin by washing with CHG (chlorahexidine gluconate) soap before surgery.  CHG is an antiseptic cleaner which kills germs and bonds with the skin to continue killing germs even after washing. Please DO NOT use if you have an allergy to CHG or antibacterial soaps.  If your skin becomes reddened/irritated stop using the CHG and inform your nurse when you arrive at Short Stay. Do not shave (including legs and underarms) for at least 48 hours prior to the first CHG shower.  You may shave your face/neck.  Please follow these instructions carefully:  1.  Shower with CHG Soap the night before surgery and the  morning of surgery.  2.  If you choose to  wash your hair, wash your hair first as usual with your normal  shampoo.  3.  After you shampoo, rinse your hair and body thoroughly to remove the shampoo.                             4.  Use CHG as you would any other liquid soap.  You can apply chg directly to the skin and wash.  Gently with a scrungie or clean washcloth.  5.  Apply the CHG Soap to your body ONLY FROM THE NECK DOWN.   Do   not use on face/ open                            Wound or open sores. Avoid contact with eyes, ears mouth and   genitals (private parts).                       Wash face,  Genitals (private parts) with your normal soap.             6.  Wash thoroughly, paying special attention to the area where your    surgery  will be performed.  7.  Thoroughly rinse your body with warm water from the neck down.  8.  DO NOT shower/wash with your normal soap after using and rinsing off the CHG Soap.                9.  Pat yourself dry with a clean towel.            10.  Wear clean pajamas.            11.  Place clean sheets on your bed the night of your first shower and do not  sleep with pets. Day of Surgery : Do not apply any lotions/deodorants the morning of surgery.  Please wear clean clothes to the hospital/surgery center.  FAILURE TO FOLLOW THESE INSTRUCTIONS MAY RESULT IN THE CANCELLATION OF YOUR SURGERY  PATIENT SIGNATURE_________________________________  NURSE SIGNATURE__________________________________  ________________________________________________________________________

## 2022-11-04 NOTE — Progress Notes (Signed)
COVID Vaccine received:  []  No []  Yes Date of any COVID positive Test in last 90 days:  PCP - Dr. Mila Palmer Cardiologist -   Chest x-ray -  EKG -   Stress Test -  ECHO - 12/15/2017  EPIC Cardiac Cath -   Bowel Prep - [x]  No  []   Yes ______  Pacemaker / ICD device []  No []  Yes   Spinal Cord Stimulator:[]  No []  Yes       History of Sleep Apnea? []  No []  Yes   CPAP used?- []  No []  Yes    Does the patient monitor blood sugar?          []  No []  Yes  []  N/A  Patient has: []  NO Hx DM   []  Pre-DM                 []  DM1  []   DM2 Does patient have a [employer] or [employer]? []  No []  Yes   Fasting Blood Sugar Ranges-  Checks Blood Sugar _____ times a day  GLP1 agonist / usual dose -  GLP1 instructions:  SGLT-2 inhibitors / usual dose -  SGLT-2 instructions:   Blood Thinner / Instructions: Aspirin Instructions:  Comments:   Activity level: Patient is able / unable to climb a flight of stairs without difficulty; []  No CP  []  No SOB, but would have ___   Patient can / can not perform ADLs without assistance.   Anesthesia review:   Patient denies shortness of breath, fever, cough and chest pain at PAT appointment.  Patient verbalized understanding and agreement to the Pre-Surgical Instructions that were given to them at this PAT appointment. Patient was also educated of the need to review these PAT instructions again prior to his/her surgery.I reviewed the appropriate phone numbers to call if they have any and questions or concerns.

## 2022-11-04 NOTE — Progress Notes (Signed)
Sent message, via epic in basket, requesting orders in epic from surgeon.  

## 2022-11-05 ENCOUNTER — Ambulatory Visit: Payer: Self-pay | Admitting: Surgery

## 2022-11-05 NOTE — Progress Notes (Signed)
Second request for pre op orders in [facility]: Spoke with Kelsey  

## 2022-11-05 NOTE — H&P (Signed)
REFERRING PHYSICIAN: Emeterio Reeve, MD  PROVIDER: Naja Apperson Myra Rude, MD   Chief Complaint: New Consultation (Thyroid neoplasm of uncertain behavior)  History of Present Illness:  Patient is referred by Dr. 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 Patient has a long history of thyroid disease dating back to high school when she underwent thyroid surgery for removal of a thyroid nodule. Patient has been on levothyroxine since that time. She currently takes levothyroxine 75 mcg daily. Over the past 2 to 3 years she has noted an increase in the size of her thyroid gland with asymmetry. She has noted some voice changes, a chronic cough, and dysphagia. Patient describes a globus sensation. There is a history of thyroid disease in the patient's mother who likely had thyroid surgery. There is no definite history of thyroid cancer. Patient underwent an ultrasound examination in November 2023 showing a mildly enlarged thyroid gland with bilateral thyroid nodules. The dominant nodule on the right measured 3.4 cm and the dominant nodule on the left measured 3.7 cm. Fine-needle aspiration of the dominant nodule on the left was benign. However the biopsy of the nodule on the right showed cytologic atypia and was submitted for molecular genetic testing with [facility]. This returned with a result of suspicious, rendering a risk of malignancy of approximately 50%. Patient is now referred to consider thyroid surgery for definitive diagnosis and management. Patient is accompanied by her family. 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  Review of Systems: A complete review of systems was obtained from the patient. I have reviewed this information and discussed as appropriate with the patient. See HPI as well for other ROS.  Review of Systems  Constitutional: Negative.  HENT:  Globus  sensation Voice changes / hoarseness  Eyes: Negative.  Respiratory: Positive for cough.  Cardiovascular: Negative.  Gastrointestinal:  Dysphagia  Genitourinary: Negative.  Musculoskeletal: Negative.  Skin: Negative.  Neurological: Negative.  Endo/Heme/Allergies: Negative.  Psychiatric/Behavioral: Negative.    Medical History: Past Medical History:  Diagnosis Date  Anxiety  Arthritis  Hyperlipidemia  Hypertension  Thyroid disease   Patient Active Problem List  Diagnosis  Multiple thyroid nodules  Neoplasm of uncertain behavior of thyroid gland   Past Surgical History:  Procedure Laterality Date  TUMOR IN CHEST 2001  Dr Edwyna Shell    No Known Allergies  Current Outpatient Medications on File Prior to Visit  Medication Sig Dispense Refill  atorvastatin (LIPITOR) 40 MG tablet Take 40 mg by mouth every evening  citalopram (CELEXA) 20 MG tablet Take 20 mg by mouth once daily  cyanocobalamin (VITAMIN B12) 1000 MCG tablet Take 1,000 mcg by mouth once daily  folic acid/multivit-min/lutein (CENTRUM SILVER ORAL) Take by mouth  ginkgo/choline bitartrate (BRAINSTRONG MEMORY SUPPORT ORAL) Take by mouth  Lacto no.76/Bifido/FOS/larch (WOMEN'S PROBIOTIC ORAL) Take by mouth  levothyroxine (SYNTHROID) 75 MCG tablet  mv-min/vit C/glut/lysine/hb124 (IMMUNE SUPPORT ORAL) Take by mouth  olmesartan-hydroCHLOROthiazide (BENICAR HCT) 20-12.5 mg tablet  vitamin C-multivitamin-mineral (EMERGEN-C) 500 mg Chew Take by mouth  VITAMIN D2-VITAMIN K1 ORAL Take by mouth  ZINC ORAL Take by mouth  zolpidem (AMBIEN) 10 mg tablet Take 5 mg by mouth at bedtime   No current facility-administered medications on file prior to visit.   History reviewed. No pertinent family history.   Social History   Tobacco Use  Smoking Status Never  Smokeless Tobacco Never    Social History  Socioeconomic History  Marital status: Married  Tobacco Use  Smoking status: Never  Smokeless tobacco: Never   Substance and Sexual Activity  Alcohol use: Yes  Drug use: Never   Objective:   Vitals:  BP: (!) 148/84  Pulse: 73  Temp: 36.4 C (97.6 F)  SpO2: 95%  Weight: 83.4 kg (183 lb 12.8 oz)  Height: 172.7 cm (5\' 8" )  PainSc: 0-No pain  PainLoc: Neck   Body mass index is 27.95 kg/m.  Physical Exam   GENERAL APPEARANCE Comfortable, no acute issues Development: normal Gross deformities: none  SKIN Rash, lesions, ulcers: none Induration, erythema: none Nodules: none palpable  EYES Conjunctiva and lids: normal Pupils: equal and reactive  EARS, NOSE, MOUTH, THROAT External ears: no lesion or deformity External nose: no lesion or deformity Hearing: grossly normal  NECK Symmetric: yes Trachea: midline Thyroid: Palpation of the right thyroid lobe shows a dominant smooth nodule located inferiorly and extending down to the right clavicle. It is smooth and nontender. It is mobile with swallowing. Palpation of the left thyroid lobe shows subtle nodularity without discrete or dominant mass. There is no associated lymphadenopathy.  ABDOMEN Not assessed  GENITOURINARY/RECTAL Not assessed  MUSCULOSKELETAL Station and gait: normal Digits and nails: no clubbing or cyanosis Muscle strength: grossly normal all extremities Range of motion: grossly normal all extremities Deformity: none  LYMPHATIC Cervical: none palpable Supraclavicular: none palpable  PSYCHIATRIC Oriented to person, place, and time: yes Mood and affect: normal for situation Judgment and insight: appropriate for situation   Assessment and Plan:   Multiple thyroid nodules Neoplasm of uncertain behavior of thyroid gland  Patient is referred by her primary care physician, Dr. 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  Patient provided with a copy of "The Thyroid Book: Medical and Surgical Treatment of  Thyroid Problems", published by [employer], 16 pages. Book reviewed and explained to patient during visit today.  Today we reviewed her clinical history, her surgical history, her recent imaging studies, her cytopathology results, and the results of molecular genetic testing. Patient has a dominant right thyroid nodule which has a 50% risk of malignancy. She has bilateral nodules on exam. I have recommended proceeding with total thyroidectomy as the procedure of choice. We have discussed the procedure. We discussed the size and location of the surgical incision. We discussed the risk and benefits of surgery including the risk of recurrent laryngeal nerve injury and injury to parathyroid glands. We discussed the hospital stay to be anticipated. We discussed the postoperative recovery and returned to work and activities. We discussed the need for lifelong thyroid hormone replacement. We discussed the potential need for radioactive iodine treatment. The patient understands and wishes to proceed with surgery in the near future.   Darnell Level, MD [facility] practice Office: [phone]

## 2022-11-05 NOTE — H&P (View-Only) (Signed)
    REFERRING PHYSICIAN: Wolters, [name] A, MD  PROVIDER: Brad Lieurance MICHAEL Osama Coleson, MD   Chief Complaint: New Consultation (Thyroid neoplasm of uncertain behavior)  History of Present Illness:  Patient is referred by Dr. 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 Patient has a long history of thyroid disease dating back to high school when she underwent thyroid surgery for removal of a thyroid nodule. Patient has been on levothyroxine since that time. She currently takes levothyroxine 75 mcg daily. Over the past 2 to 3 years she has noted an increase in the size of her thyroid gland with asymmetry. She has noted some voice changes, a chronic cough, and dysphagia. Patient describes a globus sensation. There is a history of thyroid disease in the patient's mother who likely had thyroid surgery. There is no definite history of thyroid cancer. Patient underwent an ultrasound examination in November 2023 showing a mildly enlarged thyroid gland with bilateral thyroid nodules. The dominant nodule on the right measured 3.4 cm and the dominant nodule on the left measured 3.7 cm. Fine-needle aspiration of the dominant nodule on the left was benign. However the biopsy of the nodule on the right showed cytologic atypia and was submitted for molecular genetic testing with AFIRMA. This returned with a result of suspicious, rendering a risk of malignancy of approximately 50%. Patient is now referred to consider thyroid surgery for definitive diagnosis and management. Patient is accompanied by her family. 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  Review of Systems: A complete review of systems was obtained from the patient. I have reviewed this information and discussed as appropriate with the patient. See HPI as well for other ROS.  Review of Systems  Constitutional: Negative.  HENT:  Globus  sensation Voice changes / hoarseness  Eyes: Negative.  Respiratory: Positive for cough.  Cardiovascular: Negative.  Gastrointestinal:  Dysphagia  Genitourinary: Negative.  Musculoskeletal: Negative.  Skin: Negative.  Neurological: Negative.  Endo/Heme/Allergies: Negative.  Psychiatric/Behavioral: Negative.    Medical History: Past Medical History:  Diagnosis Date  Anxiety  Arthritis  Hyperlipidemia  Hypertension  Thyroid disease   Patient Active Problem List  Diagnosis  Multiple thyroid nodules  Neoplasm of uncertain behavior of thyroid gland   Past Surgical History:  Procedure Laterality Date  TUMOR IN CHEST 2001  Dr Burney    No Known Allergies  Current Outpatient Medications on File Prior to Visit  Medication Sig Dispense Refill  atorvastatin (LIPITOR) 40 MG tablet Take 40 mg by mouth every evening  citalopram (CELEXA) 20 MG tablet Take 20 mg by mouth once daily  cyanocobalamin (VITAMIN B12) 1000 MCG tablet Take 1,000 mcg by mouth once daily  folic acid/multivit-min/lutein (CENTRUM SILVER ORAL) Take by mouth  ginkgo/choline bitartrate (BRAINSTRONG MEMORY SUPPORT ORAL) Take by mouth  Lacto no.76/Bifido/FOS/larch (WOMEN'S PROBIOTIC ORAL) Take by mouth  levothyroxine (SYNTHROID) 75 MCG tablet  mv-min/vit C/glut/lysine/hb124 (IMMUNE SUPPORT ORAL) Take by mouth  olmesartan-hydroCHLOROthiazide (BENICAR HCT) 20-12.5 mg tablet  vitamin C-multivitamin-mineral (EMERGEN-C) 500 mg Chew Take by mouth  VITAMIN D2-VITAMIN K1 ORAL Take by mouth  ZINC ORAL Take by mouth  zolpidem (AMBIEN) 10 mg tablet Take 5 mg by mouth at bedtime   No current facility-administered medications on file prior to visit.   History reviewed. No pertinent family history.   Social History   Tobacco Use  Smoking Status Never  Smokeless Tobacco Never    Social History     Socioeconomic History  Marital status: Married  Tobacco Use  Smoking status: Never  Smokeless tobacco: Never   Substance and Sexual Activity  Alcohol use: Yes  Drug use: Never   Objective:   Vitals:  BP: (!) 148/84  Pulse: 73  Temp: 36.4 C (97.6 F)  SpO2: 95%  Weight: 83.4 kg (183 lb 12.8 oz)  Height: 172.7 cm (5' 8")  PainSc: 0-No pain  PainLoc: Neck   Body mass index is 27.95 kg/m.  Physical Exam   GENERAL APPEARANCE Comfortable, no acute issues Development: normal Gross deformities: none  SKIN Rash, lesions, ulcers: none Induration, erythema: none Nodules: none palpable  EYES Conjunctiva and lids: normal Pupils: equal and reactive  EARS, NOSE, MOUTH, THROAT External ears: no lesion or deformity External nose: no lesion or deformity Hearing: grossly normal  NECK Symmetric: yes Trachea: midline Thyroid: Palpation of the right thyroid lobe shows a dominant smooth nodule located inferiorly and extending down to the right clavicle. It is smooth and nontender. It is mobile with swallowing. Palpation of the left thyroid lobe shows subtle nodularity without discrete or dominant mass. There is no associated lymphadenopathy.  ABDOMEN Not assessed  GENITOURINARY/RECTAL Not assessed  MUSCULOSKELETAL Station and gait: normal Digits and nails: no clubbing or cyanosis Muscle strength: grossly normal all extremities Range of motion: grossly normal all extremities Deformity: none  LYMPHATIC Cervical: none palpable Supraclavicular: none palpable  PSYCHIATRIC Oriented to person, place, and time: yes Mood and affect: normal for situation Judgment and insight: appropriate for situation   Assessment and Plan:   Multiple thyroid nodules Neoplasm of uncertain behavior of thyroid gland  Patient is referred by her primary care physician, Dr. 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  Patient provided with a copy of "The Thyroid Book: Medical and Surgical Treatment of  Thyroid Problems", published by [employer], 16 pages. Book reviewed and explained to patient during visit today.  Today we reviewed her clinical history, her surgical history, her recent imaging studies, her cytopathology results, and the results of molecular genetic testing. Patient has a dominant right thyroid nodule which has a 50% risk of malignancy. She has bilateral nodules on exam. I have recommended proceeding with total thyroidectomy as the procedure of choice. We have discussed the procedure. We discussed the size and location of the surgical incision. We discussed the risk and benefits of surgery including the risk of recurrent laryngeal nerve injury and injury to parathyroid glands. We discussed the hospital stay to be anticipated. We discussed the postoperative recovery and returned to work and activities. We discussed the need for lifelong thyroid hormone replacement. We discussed the potential need for radioactive iodine treatment. The patient understands and wishes to proceed with surgery in the near future.   Laymon Stockert, MD [facility] practice Office: [phone]  

## 2022-11-06 ENCOUNTER — Inpatient Hospital Stay (HOSPITAL_COMMUNITY)
Admission: RE | Admit: 2022-11-06 | Discharge: 2022-11-06 | Disposition: A | Discharge status: Home / Self Care | Payer: Medicare Other | Source: Ambulatory Visit

## 2022-11-06 DIAGNOSIS — Z01818 Encounter for other preprocedural examination: Secondary | ICD-10-CM

## 2022-11-06 DIAGNOSIS — I10 Essential (primary) hypertension: Secondary | ICD-10-CM

## 2022-11-09 ENCOUNTER — Ambulatory Visit (HOSPITAL_COMMUNITY)
Admission: RE | Admit: 2022-11-09 | Discharge: 2022-11-09 | Disposition: A | Discharge status: Home / Self Care | Payer: Medicare Other | Source: Ambulatory Visit | Attending: Anesthesiology | Admitting: Anesthesiology

## 2022-11-09 ENCOUNTER — Other Ambulatory Visit: Payer: Self-pay

## 2022-11-09 ENCOUNTER — Encounter (HOSPITAL_COMMUNITY): Payer: Self-pay

## 2022-11-09 ENCOUNTER — Encounter (HOSPITAL_COMMUNITY)
Admission: RE | Admit: 2022-11-09 | Discharge: 2022-11-09 | Disposition: A | Discharge status: Home / Self Care | Payer: Medicare Other | Source: Ambulatory Visit | Attending: Surgery | Admitting: Surgery

## 2022-11-09 DIAGNOSIS — Z01818 Encounter for other preprocedural examination: Secondary | ICD-10-CM | POA: Diagnosis not present

## 2022-11-09 DIAGNOSIS — R0989 Other specified symptoms and signs involving the circulatory and respiratory systems: Secondary | ICD-10-CM | POA: Diagnosis not present

## 2022-11-09 DIAGNOSIS — I10 Essential (primary) hypertension: Secondary | ICD-10-CM | POA: Insufficient documentation

## 2022-11-09 HISTORY — DX: Unspecified osteoarthritis, unspecified site: M19.90

## 2022-11-09 HISTORY — DX: Prediabetes: R73.03

## 2022-11-09 HISTORY — DX: Anxiety disorder, unspecified: F41.9

## 2022-11-09 LAB — CBC
HCT: 42.9 % (ref 36.0–46.0)
Hemoglobin: 13.3 g/dL (ref 12.0–15.0)
MCH: 26 pg (ref 26.0–34.0)
MCHC: 31 g/dL (ref 30.0–36.0)
MCV: 83.8 fL (ref 80.0–100.0)
Platelets: 216 10*3/uL (ref 150–400)
RBC: 5.12 MIL/uL — ABNORMAL HIGH (ref 3.87–5.11)
RDW: 14.5 % (ref 11.5–15.5)
WBC: 4.3 10*3/uL (ref 4.0–10.5)
nRBC: 0 % (ref 0.0–0.2)

## 2022-11-09 LAB — BASIC METABOLIC PANEL
Anion gap: 9 (ref 5–15)
BUN: 12 mg/dL (ref 8–23)
CO2: 29 mmol/L (ref 22–32)
Calcium: 9.2 mg/dL (ref 8.9–10.3)
Chloride: 100 mmol/L (ref 98–111)
Creatinine, Ser: 0.8 mg/dL (ref 0.44–1.00)
GFR, Estimated: >60 mL/min (ref >60)
Glucose, Bld: 98 mg/dL (ref 70–99)
Potassium: 3.7 mmol/L (ref 3.5–5.1)
Sodium: 138 mmol/L (ref 135–145)

## 2022-11-09 NOTE — Progress Notes (Signed)
For Short Stay: COVID SWAB appointment date:  Bowel Prep reminder:   For Anesthesia: PCP - Dr. Mila Palmer Cardiologist - N/A  Chest x-ray - 11/09/22 EKG - 11/09/22 Stress Test -  ECHO -  Cardiac Cath -  Pacemaker/ICD device last checked: Pacemaker orders received: Device Rep notified:  Spinal Cord Stimulator: N/A  Sleep Study - N/A CPAP -   Fasting Blood Sugar - N/A Checks Blood Sugar _____ times a day Date and result of last Hgb A1c-  Last dose of GLP1 agonist- N/A GLP1 instructions:   Last dose of SGLT-2 inhibitors- N/A SGLT-2 instructions:   Blood Thinner Instructions: N/A Aspirin Instructions: Last Dose:  Activity level: Can go up a flight of stairs and activities of daily living without stopping and without chest pain and/or shortness of breath   Able to exercise without chest pain and/or shortness of breath    Anesthesia review: Hx: HTN  Patient denies shortness of breath, fever, cough and chest pain at PAT appointment   Patient verbalized understanding of instructions that were given to them at the PAT appointment. Patient was also instructed that they will need to review over the PAT instructions again at home before surgery.

## 2022-11-09 NOTE — Patient Instructions (Signed)
DUE TO COVID-19 ONLY TWO VISITORS  (aged 77 and older)  ARE ALLOWED TO COME WITH YOU AND STAY IN THE WAITING ROOM ONLY DURING PRE OP AND PROCEDURE.   **NO VISITORS ARE ALLOWED IN THE SHORT STAY AREA OR RECOVERY ROOM!!**  IF YOU WILL BE ADMITTED INTO THE HOSPITAL YOU ARE ALLOWED ONLY FOUR SUPPORT PEOPLE DURING VISITATION HOURS ONLY (7 AM -8PM)   The support person(s) must pass our screening, gel in and out, and wear a mask at all times, including in the patient's room. Patients must also wear a mask when staff or their support person are in the room. Visitors GUEST BADGE MUST BE WORN VISIBLY  One adult visitor may remain with you overnight and MUST be in the room by 8 P.M.     Your procedure is scheduled on: 11/16/22   Report to [facility] Main Entrance    Report to admitting at : 10:45 AM   Call this number if you have problems the morning of surgery [phone]   Do not eat food :After Midnight.   After Midnight you may have the following liquids until: 10:00 AM DAY OF SURGERY  Water Black Coffee (sugar ok, NO MILK/CREAM OR CREAMERS)  Tea (sugar ok, NO MILK/CREAM OR CREAMERS) regular and decaf                             Plain Jell-O (NO RED)                                           Fruit ices (not with fruit pulp, NO RED)                                     Popsicles (NO RED)                                                                  Juice: apple, WHITE grape, WHITE cranberry Sports drinks like Gatorade (NO RED)               Oral Hygiene is also important to reduce your risk of infection.                                    Remember - BRUSH YOUR TEETH THE MORNING OF SURGERY WITH YOUR REGULAR TOOTHPASTE  DENTURES WILL BE REMOVED PRIOR TO SURGERY PLEASE DO NOT APPLY "Poly grip" OR ADHESIVES!!!    Take these medicines the morning of surgery with A SIP OF WATER: Citalopram(Celexa),levothyroxine(synthroid)                              You may not have any metal on  your body including hair pins, jewelry, and body piercing             Do not wear make-up, lotions, powders, perfumes/cologne, or deodorant  Do not wear nail polish including gel and  S&S, artificial/acrylic nails, or any other type of covering on natural nails including finger and toenails. If you have artificial nails, gel coating, etc. that needs to be removed by a nail salon please have this removed prior to surgery or surgery may need to be canceled/ delayed if the surgeon/ anesthesia feels like they are unable to be safely monitored.   Do not shave  48 hours prior to surgery.    Do not bring valuables to the hospital. [facility] IS NOT             RESPONSIBLE   FOR VALUABLES.   Contacts, glasses, or bridgework may not be worn into surgery.   Bring small overnight bag day of surgery.   DO NOT BRING YOUR HOME MEDICATIONS TO THE HOSPITAL. PHARMACY WILL DISPENSE MEDICATIONS LISTED ON YOUR MEDICATION LIST TO YOU DURING YOUR ADMISSION IN THE HOSPITAL!    Patients discharged on the day of surgery will not be allowed to drive home.  Someone NEEDS to stay with you for the first 24 hours after anesthesia.   Special Instructions: Bring a copy of your healthcare power of attorney and living will documents         the day of surgery if you haven't scanned them before.              Please read over the following fact sheets you were given: IF YOU HAVE QUESTIONS ABOUT YOUR PRE-OP INSTRUCTIONS PLEASE CALL [phone]    [facility] - Preparing for Surgery Before surgery, you can play an important role.  Because skin is not sterile, your skin needs to be as free of germs as possible.  You can reduce the number of germs on your skin by washing with CHG (chlorahexidine gluconate) soap before surgery.  CHG is an antiseptic cleaner which kills germs and bonds with the skin to continue killing germs even after washing. Please DO NOT use if you have an allergy to CHG or antibacterial soaps.  If your skin  becomes reddened/irritated stop using the CHG and inform your nurse when you arrive at Short Stay. Do not shave (including legs and underarms) for at least 48 hours prior to the first CHG shower.  You may shave your face/neck. Please follow these instructions carefully:  1.  Shower with CHG Soap the night before surgery and the  morning of Surgery.  2.  If you choose to wash your hair, wash your hair first as usual with your  normal  shampoo.  3.  After you shampoo, rinse your hair and body thoroughly to remove the  shampoo.                           4.  Use CHG as you would any other liquid soap.  You can apply chg directly  to the skin and wash                       Gently with a scrungie or clean washcloth.  5.  Apply the CHG Soap to your body ONLY FROM THE NECK DOWN.   Do not use on face/ open                           Wound or open sores. Avoid contact with eyes, ears mouth and genitals (private parts).  Wash face,  Genitals (private parts) with your normal soap.             6.  Wash thoroughly, paying special attention to the area where your surgery  will be performed.  7.  Thoroughly rinse your body with warm water from the neck down.  8.  DO NOT shower/wash with your normal soap after using and rinsing off  the CHG Soap.                9.  Pat yourself dry with a clean towel.            10.  Wear clean pajamas.            11.  Place clean sheets on your bed the night of your first shower and do not  sleep with pets. Day of Surgery : Do not apply any lotions/deodorants the morning of surgery.  Please wear clean clothes to the hospital/surgery center.  FAILURE TO FOLLOW THESE INSTRUCTIONS MAY RESULT IN THE CANCELLATION OF YOUR SURGERY PATIENT SIGNATURE_________________________________  NURSE SIGNATURE__________________________________  ________________________________________________________________________

## 2022-11-15 ENCOUNTER — Encounter (HOSPITAL_COMMUNITY): Payer: Self-pay | Admitting: Surgery

## 2022-11-16 ENCOUNTER — Other Ambulatory Visit: Payer: Self-pay

## 2022-11-16 ENCOUNTER — Encounter (HOSPITAL_COMMUNITY)
Admission: RE | Disposition: A | Discharge status: Home / Self Care | Payer: Self-pay | Source: Ambulatory Visit | Attending: Surgery

## 2022-11-16 ENCOUNTER — Ambulatory Visit (HOSPITAL_COMMUNITY)
Admission: RE | Admit: 2022-11-16 | Discharge: 2022-11-17 | Disposition: A | Discharge status: Home / Self Care | Payer: Medicare Other | Source: Ambulatory Visit | Attending: Surgery | Admitting: Surgery

## 2022-11-16 ENCOUNTER — Encounter (HOSPITAL_COMMUNITY): Payer: Self-pay | Admitting: Surgery

## 2022-11-16 ENCOUNTER — Ambulatory Visit (HOSPITAL_BASED_OUTPATIENT_CLINIC_OR_DEPARTMENT_OTHER): Payer: Medicare Other | Admitting: Anesthesiology

## 2022-11-16 ENCOUNTER — Ambulatory Visit (HOSPITAL_COMMUNITY): Payer: Medicare Other | Admitting: Anesthesiology

## 2022-11-16 DIAGNOSIS — F32A Depression, unspecified: Secondary | ICD-10-CM | POA: Insufficient documentation

## 2022-11-16 DIAGNOSIS — R053 Chronic cough: Secondary | ICD-10-CM | POA: Diagnosis not present

## 2022-11-16 DIAGNOSIS — E785 Hyperlipidemia, unspecified: Secondary | ICD-10-CM

## 2022-11-16 DIAGNOSIS — E039 Hypothyroidism, unspecified: Secondary | ICD-10-CM | POA: Diagnosis not present

## 2022-11-16 DIAGNOSIS — Z79890 Hormone replacement therapy (postmenopausal): Secondary | ICD-10-CM | POA: Diagnosis not present

## 2022-11-16 DIAGNOSIS — D440 Neoplasm of uncertain behavior of thyroid gland: Secondary | ICD-10-CM | POA: Diagnosis present

## 2022-11-16 DIAGNOSIS — R911 Solitary pulmonary nodule: Secondary | ICD-10-CM | POA: Diagnosis not present

## 2022-11-16 DIAGNOSIS — E042 Nontoxic multinodular goiter: Secondary | ICD-10-CM | POA: Diagnosis not present

## 2022-11-16 DIAGNOSIS — F419 Anxiety disorder, unspecified: Secondary | ICD-10-CM | POA: Diagnosis not present

## 2022-11-16 DIAGNOSIS — Z01818 Encounter for other preprocedural examination: Secondary | ICD-10-CM

## 2022-11-16 DIAGNOSIS — I10 Essential (primary) hypertension: Secondary | ICD-10-CM | POA: Insufficient documentation

## 2022-11-16 DIAGNOSIS — Z79899 Other long term (current) drug therapy: Secondary | ICD-10-CM | POA: Diagnosis not present

## 2022-11-16 SURGERY — THYROIDECTOMY, COMPLETION
Anesthesia: General

## 2022-11-16 MED ORDER — OXYCODONE HCL 5 MG PO TABS
5.0000 mg | ORAL_TABLET | ORAL | Status: DC | PRN
  Administered 2022-11-16: 10 mg via ORAL
  Filled 2022-11-16: qty 2

## 2022-11-16 MED ORDER — DEXMEDETOMIDINE HCL IN NACL 80 MCG/20ML IV SOLN
INTRAVENOUS | Status: AC
  Filled 2022-11-16: qty 20

## 2022-11-16 MED ORDER — ACETAMINOPHEN 500 MG PO TABS
1000.0000 mg | ORAL_TABLET | Freq: Once | ORAL | Status: AC
  Administered 2022-11-16: 1000 mg via ORAL
  Filled 2022-11-16: qty 2

## 2022-11-16 MED ORDER — HEMOSTATIC AGENTS (NO CHARGE) OPTIME
TOPICAL | Status: DC | PRN
  Administered 2022-11-16: 1 via TOPICAL

## 2022-11-16 MED ORDER — AMISULPRIDE (ANTIEMETIC) 5 MG/2ML IV SOLN: 10.0000 mg | Freq: Once | INTRAVENOUS | Status: DC | PRN

## 2022-11-16 MED ORDER — FENTANYL CITRATE (PF) 100 MCG/2ML IJ SOLN
INTRAMUSCULAR | Status: AC
  Filled 2022-11-16: qty 2

## 2022-11-16 MED ORDER — CEFAZOLIN SODIUM-DEXTROSE 2-4 GM/100ML-% IV SOLN
2.0000 g | INTRAVENOUS | Status: AC
  Administered 2022-11-16: 2 g via INTRAVENOUS
  Filled 2022-11-16: qty 100

## 2022-11-16 MED ORDER — FENTANYL CITRATE (PF) 100 MCG/2ML IJ SOLN
INTRAMUSCULAR | Status: DC | PRN
  Administered 2022-11-16 (×4): 50 ug via INTRAVENOUS

## 2022-11-16 MED ORDER — OXYCODONE HCL 5 MG/5ML PO SOLN: 5.0000 mg | Freq: Once | ORAL | Status: AC

## 2022-11-16 MED ORDER — CALCIUM CARBONATE 1250 (500 CA) MG PO TABS
2.0000 | ORAL_TABLET | Freq: Three times a day (TID) | ORAL | Status: DC
  Administered 2022-11-17: 2500 mg via ORAL
  Filled 2022-11-16: qty 2

## 2022-11-16 MED ORDER — ACETAMINOPHEN 325 MG PO TABS: 650.0000 mg | ORAL_TABLET | Freq: Four times a day (QID) | ORAL | Status: DC | PRN

## 2022-11-16 MED ORDER — PROPOFOL 10 MG/ML IV BOLUS
INTRAVENOUS | Status: DC | PRN
  Administered 2022-11-16: 200 mg via INTRAVENOUS

## 2022-11-16 MED ORDER — CITALOPRAM HYDROBROMIDE 20 MG PO TABS
20.0000 mg | ORAL_TABLET | Freq: Every day | ORAL | Status: DC
  Administered 2022-11-17: 20 mg via ORAL
  Filled 2022-11-16: qty 1

## 2022-11-16 MED ORDER — HYDRALAZINE HCL 20 MG/ML IJ SOLN
INTRAMUSCULAR | Status: AC
  Filled 2022-11-16: qty 1

## 2022-11-16 MED ORDER — EPHEDRINE SULFATE (PRESSORS) 50 MG/ML IJ SOLN
INTRAMUSCULAR | Status: DC | PRN
  Administered 2022-11-16 (×3): 10 mg via INTRAVENOUS

## 2022-11-16 MED ORDER — LIDOCAINE HCL (CARDIAC) PF 100 MG/5ML IV SOSY
PREFILLED_SYRINGE | INTRAVENOUS | Status: DC | PRN
  Administered 2022-11-16: 60 mg via INTRAVENOUS

## 2022-11-16 MED ORDER — ROCURONIUM BROMIDE 10 MG/ML (PF) SYRINGE
PREFILLED_SYRINGE | INTRAVENOUS | Status: AC
  Filled 2022-11-16: qty 20

## 2022-11-16 MED ORDER — ONDANSETRON HCL 4 MG/2ML IJ SOLN
INTRAMUSCULAR | Status: DC | PRN
  Administered 2022-11-16: 4 mg via INTRAVENOUS

## 2022-11-16 MED ORDER — CHLORHEXIDINE GLUCONATE CLOTH 2 % EX PADS: 6.0000 | MEDICATED_PAD | Freq: Once | CUTANEOUS | Status: DC

## 2022-11-16 MED ORDER — IRBESARTAN 150 MG PO TABS
150.0000 mg | ORAL_TABLET | Freq: Every day | ORAL | Status: DC
  Administered 2022-11-17: 150 mg via ORAL
  Filled 2022-11-16: qty 1

## 2022-11-16 MED ORDER — LACTATED RINGERS IV SOLN: INTRAVENOUS | Status: DC

## 2022-11-16 MED ORDER — ZOLPIDEM TARTRATE 5 MG PO TABS: 5.0000 mg | ORAL_TABLET | Freq: Every day | ORAL | Status: DC

## 2022-11-16 MED ORDER — LEVOTHYROXINE SODIUM 75 MCG PO TABS
75.0000 ug | ORAL_TABLET | Freq: Every day | ORAL | Status: DC
  Administered 2022-11-17: 75 ug via ORAL
  Filled 2022-11-16: qty 1

## 2022-11-16 MED ORDER — 0.9 % SODIUM CHLORIDE (POUR BTL) OPTIME
TOPICAL | Status: DC | PRN
  Administered 2022-11-16: 1000 mL

## 2022-11-16 MED ORDER — DEXAMETHASONE SODIUM PHOSPHATE 10 MG/ML IJ SOLN
INTRAMUSCULAR | Status: AC
  Filled 2022-11-16: qty 1

## 2022-11-16 MED ORDER — TRAMADOL HCL 50 MG PO TABS: 50.0000 mg | ORAL_TABLET | Freq: Four times a day (QID) | ORAL | Status: DC | PRN

## 2022-11-16 MED ORDER — ONDANSETRON HCL 4 MG/2ML IJ SOLN
INTRAMUSCULAR | Status: AC
  Filled 2022-11-16: qty 2

## 2022-11-16 MED ORDER — HYDROMORPHONE HCL 1 MG/ML IJ SOLN: 1.0000 mg | INTRAMUSCULAR | Status: DC | PRN

## 2022-11-16 MED ORDER — SUGAMMADEX SODIUM 200 MG/2ML IV SOLN
INTRAVENOUS | Status: DC | PRN
  Administered 2022-11-16: 200 mg via INTRAVENOUS

## 2022-11-16 MED ORDER — DEXAMETHASONE SODIUM PHOSPHATE 4 MG/ML IJ SOLN
INTRAMUSCULAR | Status: DC | PRN
  Administered 2022-11-16: 5 mg via INTRAVENOUS

## 2022-11-16 MED ORDER — ONDANSETRON HCL 4 MG/2ML IJ SOLN: 4.0000 mg | Freq: Four times a day (QID) | INTRAMUSCULAR | Status: DC | PRN

## 2022-11-16 MED ORDER — LACTATED RINGERS IV SOLN: INTRAVENOUS | Status: DC | PRN

## 2022-11-16 MED ORDER — OXYCODONE HCL 5 MG/5ML PO SOLN
ORAL | Status: AC
  Administered 2022-11-16: 5 mg via ORAL
  Filled 2022-11-16: qty 5

## 2022-11-16 MED ORDER — FENTANYL CITRATE PF 50 MCG/ML IJ SOSY
25.0000 ug | PREFILLED_SYRINGE | INTRAMUSCULAR | Status: DC | PRN
  Administered 2022-11-16 (×2): 50 ug via INTRAVENOUS

## 2022-11-16 MED ORDER — ROCURONIUM BROMIDE 100 MG/10ML IV SOLN
INTRAVENOUS | Status: DC | PRN
  Administered 2022-11-16: 40 mg via INTRAVENOUS
  Administered 2022-11-16: 20 mg via INTRAVENOUS

## 2022-11-16 MED ORDER — CHLORHEXIDINE GLUCONATE 0.12 % MT SOLN
15.0000 mL | Freq: Once | OROMUCOSAL | Status: AC
  Administered 2022-11-16: 15 mL via OROMUCOSAL

## 2022-11-16 MED ORDER — ORAL CARE MOUTH RINSE: 15.0000 mL | Freq: Once | OROMUCOSAL | Status: AC

## 2022-11-16 MED ORDER — HYDRALAZINE HCL 20 MG/ML IJ SOLN
10.0000 mg | Freq: Once | INTRAMUSCULAR | Status: AC
  Administered 2022-11-16: 10 mg via INTRAVENOUS

## 2022-11-16 MED ORDER — FENTANYL CITRATE PF 50 MCG/ML IJ SOSY
PREFILLED_SYRINGE | INTRAMUSCULAR | Status: AC
  Administered 2022-11-16: 50 ug via INTRAVENOUS
  Filled 2022-11-16: qty 3

## 2022-11-16 MED ORDER — LIDOCAINE HCL (PF) 2 % IJ SOLN
INTRAMUSCULAR | Status: AC
  Filled 2022-11-16: qty 15

## 2022-11-16 MED ORDER — ONDANSETRON 4 MG PO TBDP: 4.0000 mg | ORAL_TABLET | Freq: Four times a day (QID) | ORAL | Status: DC | PRN

## 2022-11-16 MED ORDER — ACETAMINOPHEN 650 MG RE SUPP: 650.0000 mg | Freq: Four times a day (QID) | RECTAL | Status: DC | PRN

## 2022-11-16 MED ORDER — PHENYLEPHRINE 80 MCG/ML (10ML) SYRINGE FOR IV PUSH (FOR BLOOD PRESSURE SUPPORT)
PREFILLED_SYRINGE | INTRAVENOUS | Status: AC
  Filled 2022-11-16: qty 10

## 2022-11-16 MED ORDER — OLMESARTAN MEDOXOMIL-HCTZ 20-12.5 MG PO TABS: 1.0000 | ORAL_TABLET | Freq: Every day | ORAL | Status: DC

## 2022-11-16 MED ORDER — HYDROCHLOROTHIAZIDE 12.5 MG PO TABS
12.5000 mg | ORAL_TABLET | Freq: Every day | ORAL | Status: DC
  Administered 2022-11-17: 12.5 mg via ORAL
  Filled 2022-11-16: qty 1

## 2022-11-16 MED ORDER — PROPOFOL 10 MG/ML IV BOLUS
INTRAVENOUS | Status: AC
  Filled 2022-11-16: qty 20

## 2022-11-16 MED ORDER — SODIUM CHLORIDE 0.45 % IV SOLN: INTRAVENOUS | Status: DC

## 2022-11-16 MED ORDER — ONDANSETRON HCL 4 MG/2ML IJ SOLN: 4.0000 mg | Freq: Once | INTRAMUSCULAR | Status: DC | PRN

## 2022-11-16 SURGICAL SUPPLY — 36 items
ADH SKN CLS APL DERMABOND .7 (GAUZE/BANDAGES/DRESSINGS) ×1
APL PRP STRL LF DISP 70% ISPRP (MISCELLANEOUS) ×1
ATTRACTOMAT 16X20 MAGNETIC DRP (DRAPES) ×1 IMPLANT
BAG COUNTER SPONGE SURGICOUNT (BAG) ×2 IMPLANT
BAG SPNG CNTER NS LX DISP (BAG) ×1
BLADE SURG 15 STRL LF DISP TIS (BLADE) ×1 IMPLANT
BLADE SURG 15 STRL SS (BLADE) ×1
CHLORAPREP W/TINT 26 (MISCELLANEOUS) ×1 IMPLANT
CLIP TI MEDIUM 6 (CLIP) ×2 IMPLANT
CLIP TI WIDE RED SMALL 6 (CLIP) ×2 IMPLANT
COVER SURGICAL LIGHT HANDLE (MISCELLANEOUS) ×2 IMPLANT
DERMABOND ADVANCED .7 DNX12 (GAUZE/BANDAGES/DRESSINGS) ×1 IMPLANT
DRAPE LAPAROTOMY T 98X78 PEDS (DRAPES) ×1 IMPLANT
DRAPE UTILITY XL STRL (DRAPES) ×1 IMPLANT
ELECT PENCIL ROCKER SW 15FT (MISCELLANEOUS) ×1 IMPLANT
ELECT REM PT RETURN 15FT ADLT (MISCELLANEOUS) ×2 IMPLANT
GAUZE 4X4 16PLY ~~LOC~~+RFID DBL (SPONGE) ×1 IMPLANT
GLOVE SURG ORTHO 8.0 STRL STRW (GLOVE) ×1 IMPLANT
GOWN STRL REUS W/ TWL XL LVL3 (GOWN DISPOSABLE) ×4 IMPLANT
GOWN STRL REUS W/TWL XL LVL3 (GOWN DISPOSABLE) ×2
HEMOSTAT SURGICEL 2X4 FIBR (HEMOSTASIS) ×1 IMPLANT
ILLUMINATOR WAVEGUIDE N/F (MISCELLANEOUS) ×1 IMPLANT
KIT BASIN OR (CUSTOM PROCEDURE TRAY) ×2 IMPLANT
KIT TURNOVER KIT A (KITS) IMPLANT
PACK BASIC VI WITH GOWN DISP (CUSTOM PROCEDURE TRAY) ×1 IMPLANT
SHEARS HARMONIC 9CM CVD (BLADE) ×1 IMPLANT
SUT MNCRL AB 4-0 PS2 18 (SUTURE) ×1 IMPLANT
SUT SILK 2 0 (SUTURE) ×1
SUT SILK 2-0 18XBRD TIE 12 (SUTURE) IMPLANT
SUT SILK 3 0 (SUTURE) ×1
SUT SILK 3-0 18XBRD TIE 12 (SUTURE) IMPLANT
SUT VIC AB 3-0 SH 18 (SUTURE) ×2 IMPLANT
SYR BULB IRRIG 60ML STRL (SYRINGE) ×1 IMPLANT
TOWEL OR 17X26 10 PK STRL BLUE (TOWEL DISPOSABLE) ×1 IMPLANT
TOWEL OR NON WOVEN STRL DISP B (DISPOSABLE) ×1 IMPLANT
TUBING CONNECTING 10 (TUBING) ×1 IMPLANT

## 2022-11-16 NOTE — Op Note (Signed)
Procedure Note  Pre-operative Diagnosis:  Thyroid neoplasm of uncertain behavior, multiple thyroid nodules  Post-operative Diagnosis:  same  Surgeon:  Darnell Level, MD  Resident Assistant:  Clabe Seal, MD ([facility])   Procedure:  Total thyroidectomy  I was personally present during the key and critical portions of this procedure and immediately available throughout the entire procedure, as documented in my operative note.  Anesthesia:  General  Estimated Blood Loss:  20 cc  Drains: none         Specimen: thyroid to pathology  Indications:  Patient is referred by Dr. 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 Patient has a long history of thyroid disease dating back to high school when she underwent thyroid surgery for removal of a thyroid nodule. Patient has been on levothyroxine since that time. She currently takes levothyroxine 75 mcg daily. Over the past 2 to 3 years she has noted an increase in the size of her thyroid gland with asymmetry. She has noted some voice changes, a chronic cough, and dysphagia. Patient describes a globus sensation. There is a history of thyroid disease in the patient's mother who likely had thyroid surgery. There is no definite history of thyroid cancer. Patient underwent an ultrasound examination in November 2023 showing a mildly enlarged thyroid gland with bilateral thyroid nodules. The dominant nodule on the right measured 3.4 cm and the dominant nodule on the left measured 3.7 cm. Fine-needle aspiration of the dominant nodule on the left was benign. However the biopsy of the nodule on the right showed cytologic atypia and was submitted for molecular genetic testing with [facility]. This returned with a result of suspicious, rendering a risk of malignancy of approximately 50%. Patient is now referred to consider thyroid surgery for definitive diagnosis and management.  Patient is accompanied by her family. 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   Procedure Details: Procedure was done in OR #[facility] at [facility]. The patient was brought to the operating room and placed in a supine position on the operating room table. Following administration of general anesthesia, the patient was positioned and then prepped and draped in the usual aseptic fashion. After ascertaining that an adequate level of anesthesia had been achieved, a small Kocher incision was made with #15 blade. Dissection was carried through subcutaneous tissues and platysma.Hemostasis was achieved with the electrocautery. Skin flaps were elevated cephalad and caudad from the thyroid notch to the sternal notch. A Mahorner self-retaining retractor was placed for exposure. Strap muscles were incised in the midline and dissection was begun on the left side.  Strap muscles were reflected laterally.  Left thyroid lobe was nodular including a calcified nodule.  The left lobe was gently mobilized with blunt dissection. There were some "adhesions" to the capsule due to prior thyroid surgery. Superior pole vessels were dissected out and divided individually between small and medium ligaclips with the harmonic scalpel. The thyroid lobe was rolled anteriorly. Branches of the inferior thyroid artery were divided between small ligaclips with the harmonic scalpel. Inferior venous tributaries were divided between ligaclips. Both the superior and inferior parathyroid glands were identified and preserved on their vascular pedicles. The recurrent laryngeal nerve was identified and preserved along its course. The ligament of Allyson Sabal was released with the electrocautery and the gland was mobilized onto the anterior trachea. Isthmus was mobilized across the midline. There was a small pyramidal lobe present which was  resected with the isthmus. Dry pack was placed in the left neck.  The right thyroid lobe was  gently mobilized with blunt dissection. Right thyroid lobe was mildly enlarged with nodules including a firm, probably calcified nodule. Superior pole vessels were dissected out and divided between small and medium ligaclips with the Harmonic scalpel. Superior parathyroid was identified and preserved. Inferior venous tributaries were divided between medium ligaclips with the harmonic scalpel. The right thyroid lobe was rolled anteriorly and the branches of the inferior thyroid artery divided between small ligaclips. The right recurrent laryngeal nerve was identified and preserved along its course. The ligament of Allyson Sabal was released with the electrocautery. The right thyroid lobe was mobilized onto the anterior trachea and the remainder of the thyroid was dissected off the anterior trachea and the thyroid was completely excised. A suture was used to mark the right lobe. The entire thyroid gland was submitted to pathology for review.  Palpation of the operative field demonstrated no evidence of residual disease and no abnormal lymph nodes.  The neck was irrigated with warm saline. Fibrillar was placed throughout the operative field. Strap muscles were approximated in the midline with interrupted 3-0 Vicryl sutures. Platysma was closed with interrupted 3-0 Vicryl sutures. Skin was closed with a running 4-0 Monocryl subcuticular suture. Wound was washed and Dermabond was applied. The patient was awakened from anesthesia and brought to the recovery room. The patient tolerated the procedure well.   Darnell Level, MD [facility] Office: [phone]

## 2022-11-16 NOTE — Interval H&P Note (Signed)
History and Physical Interval Note:  11/16/2022 12:20 PM  Virginia Becker  has presented today for surgery, with the diagnosis of THYROID NEOPLASM OF UNCERTAIN BEHAVIOR MULTIPLE THYRPOD NODULES.  The various methods of treatment have been discussed with the patient and family. After consideration of risks, benefits and other options for treatment, the patient has consented to    Procedure(s): COMPLETION  THYROIDECTOMY (N/A) as a surgical intervention.    The patient's history has been reviewed, patient examined, no change in status, stable for surgery.  I have reviewed the patient's chart and labs.  Questions were answered to the patient's satisfaction.    Darnell Level, MD [facility] practice Office: [phone]   Darnell Level

## 2022-11-16 NOTE — Transfer of Care (Signed)
Immediate Anesthesia Transfer of Care Note  Patient: Virginia Becker  Procedure(s) Performed: COMPLETION  THYROIDECTOMY  Patient Location: PACU  Anesthesia Type:General  Level of Consciousness: awake and alert   Airway & Oxygen Therapy: Patient Spontanous Breathing and Patient connected to face mask oxygen  Post-op Assessment: Report given to RN and Post -op Vital signs reviewed and stable  Post vital signs: Reviewed and stable  Last Vitals:  Vitals Value Taken Time  BP 183/89 11/16/22 1618  Temp    Pulse 88 11/16/22 1622  Resp 14 11/16/22 1622  SpO2 100 % 11/16/22 1622  Vitals shown include unvalidated device data.  Last Pain:  Vitals:   11/16/22 1118  TempSrc:   PainSc: 0-No pain         Complications: No notable events documented.

## 2022-11-16 NOTE — Anesthesia Procedure Notes (Signed)
Procedure Name: Intubation Date/Time: 11/16/2022 2:27 PM  Performed by: Deri Fuelling, CRNAPre-anesthesia Checklist: Patient identified, Emergency Drugs available, Suction available and Patient being monitored Patient Re-evaluated:Patient Re-evaluated prior to induction Oxygen Delivery Method: Circle system utilized Preoxygenation: Pre-oxygenation with 100% oxygen Induction Type: IV induction Ventilation: Mask ventilation without difficulty Tube type: Oral Tube size: 7.0 mm Number of attempts: 1 Airway Equipment and Method: Stylet and Oral airway Placement Confirmation: ETT inserted through vocal cords under direct vision, positive ETCO2 and breath sounds checked- equal and bilateral Secured at: 21 cm Tube secured with: Tape Dental Injury: Teeth and Oropharynx as per pre-operative assessment

## 2022-11-16 NOTE — Anesthesia Preprocedure Evaluation (Addendum)
Anesthesia Evaluation  Patient identified by MRN, date of birth, ID band Patient awake    Reviewed: Allergy & Precautions, NPO status , Patient's Chart, lab work & pertinent test results  Airway Mallampati: II  TM Distance: >3 FB Neck ROM: Full    Dental  (+) Partial Lower, Upper Dentures   Pulmonary neg pulmonary ROS   Pulmonary exam normal        Cardiovascular hypertension, Pt. on medications Normal cardiovascular exam     Neuro/Psych  PSYCHIATRIC DISORDERS Anxiety Depression    negative neurological ROS     GI/Hepatic negative GI ROS, Neg liver ROS,,,  Endo/Other  Hypothyroidism    Renal/GU negative Renal ROS     Musculoskeletal  (+) Arthritis ,    Abdominal   Peds  Hematology negative hematology ROS (+)   Anesthesia Other Findings THYROID NEOPLASM OF UNCERTAIN BEHAVIOR  MULTIPLE THYRPOD NODULES    Reproductive/Obstetrics                             Anesthesia Physical Anesthesia Plan  ASA: 3  Anesthesia Plan: General   Post-op Pain Management:    Induction: Intravenous  PONV Risk Score and Plan: 4 or greater and Ondansetron, Dexamethasone, Propofol infusion, Midazolam and Treatment may vary due to age or medical condition  Airway Management Planned: Oral ETT  Additional Equipment:   Intra-op Plan:   Post-operative Plan: Extubation in OR  Informed Consent: I have reviewed the patients History and Physical, chart, labs and discussed the procedure including the risks, benefits and alternatives for the proposed anesthesia with the patient or authorized representative who has indicated his/her understanding and acceptance.     Dental advisory given  Plan Discussed with: CRNA  Anesthesia Plan Comments:        Anesthesia Quick Evaluation

## 2022-11-17 DIAGNOSIS — R053 Chronic cough: Secondary | ICD-10-CM | POA: Diagnosis not present

## 2022-11-17 DIAGNOSIS — I10 Essential (primary) hypertension: Secondary | ICD-10-CM | POA: Diagnosis not present

## 2022-11-17 DIAGNOSIS — Z79899 Other long term (current) drug therapy: Secondary | ICD-10-CM | POA: Diagnosis not present

## 2022-11-17 DIAGNOSIS — E785 Hyperlipidemia, unspecified: Secondary | ICD-10-CM | POA: Diagnosis not present

## 2022-11-17 DIAGNOSIS — D440 Neoplasm of uncertain behavior of thyroid gland: Secondary | ICD-10-CM | POA: Diagnosis not present

## 2022-11-17 DIAGNOSIS — F32A Depression, unspecified: Secondary | ICD-10-CM | POA: Diagnosis not present

## 2022-11-17 DIAGNOSIS — E042 Nontoxic multinodular goiter: Secondary | ICD-10-CM | POA: Diagnosis not present

## 2022-11-17 DIAGNOSIS — E039 Hypothyroidism, unspecified: Secondary | ICD-10-CM | POA: Diagnosis not present

## 2022-11-17 LAB — CALCIUM: Calcium: 8.5 mg/dL — ABNORMAL LOW (ref 8.9–10.3)

## 2022-11-17 MED ORDER — ONDANSETRON HCL 4 MG/2ML IJ SOLN
INTRAMUSCULAR | Status: AC
  Filled 2022-11-17: qty 2

## 2022-11-17 MED ORDER — DEXAMETHASONE SODIUM PHOSPHATE 10 MG/ML IJ SOLN
INTRAMUSCULAR | Status: AC
  Filled 2022-11-17: qty 1

## 2022-11-17 MED ORDER — CALCIUM CARBONATE ANTACID 500 MG PO CHEW: 2.0000 | CHEWABLE_TABLET | Freq: Two times a day (BID) | ORAL | 1 refills | Status: AC

## 2022-11-17 MED ORDER — LIDOCAINE HCL (PF) 2 % IJ SOLN
INTRAMUSCULAR | Status: AC
  Filled 2022-11-17: qty 10

## 2022-11-17 MED ORDER — EPHEDRINE 5 MG/ML INJ
INTRAVENOUS | Status: AC
  Filled 2022-11-17: qty 10

## 2022-11-17 MED ORDER — TRAMADOL HCL 50 MG PO TABS: 50.0000 mg | ORAL_TABLET | Freq: Four times a day (QID) | ORAL | 0 refills | Status: AC | PRN

## 2022-11-17 MED ORDER — PHENYLEPHRINE 80 MCG/ML (10ML) SYRINGE FOR IV PUSH (FOR BLOOD PRESSURE SUPPORT)
PREFILLED_SYRINGE | INTRAVENOUS | Status: AC
  Filled 2022-11-17: qty 10

## 2022-11-17 NOTE — Discharge Summary (Signed)
    Physician Discharge Summary   Patient ID: Virginia Becker MRN: [id number] DOB/AGE: 77-Mar-1947 77 y.o.  Admit date: 11/16/2022  Discharge date: 11/17/2022  Discharge Diagnoses:  Principal Problem:   Neoplasm of uncertain behavior of thyroid gland Active Problems:   Multiple thyroid nodules   Discharged Condition: good  Hospital Course: Patient was admitted for observation following thyroid surgery.  Post op course was uncomplicated.  Pain was well controlled.  Tolerated diet.  Post op calcium level on morning following surgery was 8.5 mg/dl.  Patient was prepared for discharge home on POD#1.  Consults: None  Treatments: surgery: completion thyroidectomy  Discharge Exam: Blood pressure 138/76, pulse 98, temperature 98.8 F (37.1 C), temperature source Oral, resp. rate 18, height 5' 8.5" (1.74 m), weight 82 kg, SpO2 92 %. HEENT - clear Neck - wound dry and intact; minimal STS; voice normal; Dermabond in place  Disposition: Home  Discharge Instructions     Diet - low sodium heart healthy   Complete by: As directed    Increase activity slowly   Complete by: As directed    No dressing needed   Complete by: As directed          Follow-up Information     Darnell Level, MD. Schedule an appointment as soon as possible for a visit in 3 week(s).   Specialty: General Surgery Why: For wound re-check Contact information: [address] [place] [address] [phone]                 Darnell Level, MD [facility] Office: [phone]   Signed: Darnell Level 11/17/2022, 9:40 AM

## 2022-11-17 NOTE — Plan of Care (Signed)

## 2022-11-17 NOTE — Discharge Instructions (Signed)
[facility] - Dr. Bryston Colocho  THYROID & PARATHYROID SURGERY:  POST-OP INSTRUCTIONS  Always review the instruction sheet provided by the hospital nurse at discharge.  A prescription for pain medication may be sent to your pharmacy at the time of discharge.  Take your pain medication as prescribed.  If narcotic pain medicine is not needed, then you may take acetaminophen (Tylenol) or ibuprofen (Advil) as needed for pain or soreness.  Take your normal home medications as prescribed unless otherwise directed.  If you need a refill on your pain medication, please contact the office during regular business hours.  Prescriptions will not be processed by the office after 5:00PM or on weekends.  Start with a light diet upon arrival home, such as soup and crackers or toast.  Be sure to drink plenty of fluids.  Resume your normal diet the day after surgery.  Most patients will experience some swelling and bruising on the chest and neck area.  Ice packs will help for the first 48 hours after arriving home.  Swelling and bruising will take several days to resolve.   It is common to experience some constipation after surgery.  Increasing fluid intake and taking a stool softener (Colace) will usually help to prevent this problem.  A mild laxative (Milk of Magnesia or Miralax) should be taken according to package directions if there has been no bowel movement after 48 hours.  Dermabond glue covers your incision. This seals the wound and you may shower at any time. The Dermabond will remain in place for about a week.  You may gradually remove the glue when it loosens around the edges.  If you need to loosen the Dermabond for removal, apply a layer of Vaseline to the wound for 15 minutes and then remove with a Kleenex. Your sutures are under the skin and will not show - they will dissolve on their own.  You may resume light daily activities beginning the day after discharge (such as self-care,  walking, climbing stairs), gradually increasing activities as tolerated. You may have sexual intercourse when it is comfortable. Refrain from any heavy lifting or straining until approved by your doctor. You may drive when you no longer are taking prescription pain medication, you can comfortably wear a seatbelt, and you can safely maneuver your car and apply the brakes.  You will see your doctor in the office for a follow-up appointment approximately three weeks after your surgery.  Make sure that you call for this appointment within a day or two after you arrive home to insure a convenient appointment time. Please have any requested laboratory tests performed a few days prior to your office visit so that the results will be available at your follow up appointment.  WHEN TO CALL THE [facility] OFFICE: -- Fever greater than 101.5 -- Inability to urinate -- Nausea and/or vomiting - persistent -- Extreme swelling or bruising -- Continued bleeding from incision -- Increased pain, redness, or drainage from the incision -- Difficulty swallowing or breathing -- Muscle cramping or spasms -- Numbness or tingling in hands or around lips  The clinic staff is available to answer your questions during regular business hours.  Please don't hesitate to call and ask to speak to one of the nurses if you have concerns.  [facility] OFFICE: [phone] (24 hours)  Please sign up for MyChart accounts. This will allow you to communicate directly with my nurse or myself without having to call the office. It will also allow you   to view your test results. You will need to enroll in MyChart for my office ([facility]) and for the hospital ([facility]).  Donnavan Covault, MD [facility] A [facility] practice 

## 2022-11-17 NOTE — Anesthesia Postprocedure Evaluation (Signed)
Anesthesia Post Note  Patient: Virginia Becker  Procedure(s) Performed: COMPLETION  THYROIDECTOMY     Patient location during evaluation: PACU Anesthesia Type: General Level of consciousness: awake Pain management: pain level controlled Vital Signs Assessment: post-procedure vital signs reviewed and stable Respiratory status: spontaneous breathing, nonlabored ventilation and respiratory function stable Cardiovascular status: blood pressure returned to baseline and stable Postop Assessment: no apparent nausea or vomiting Anesthetic complications: no   No notable events documented.  Last Vitals:  Vitals:   11/17/22 0138 11/17/22 0506  BP: 127/71 138/76  Pulse: 99 98  Resp: 18 18  Temp: 36.9 C 37.1 C  SpO2: 91% 92%    Last Pain:  Vitals:   11/17/22 0506  TempSrc: Oral  PainSc:    Pain Goal: Patients Stated Pain Goal: 2 (11/16/22 1938)                 Catheryn Bacon Stepan Verrette

## 2022-11-18 LAB — SURGICAL PATHOLOGY

## 2022-11-23 NOTE — Progress Notes (Signed)
Good news!  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  No further surgery or treatment is necessary at this time.  Will have copy of report at office for patient at post op visit.  Darnell Level, MD [facility] A [facility] practice Office: [phone]

## 2022-12-01 DIAGNOSIS — E039 Hypothyroidism, unspecified: Secondary | ICD-10-CM | POA: Diagnosis not present

## 2022-12-01 DIAGNOSIS — E890 Postprocedural hypothyroidism: Secondary | ICD-10-CM | POA: Diagnosis not present

## 2022-12-01 DIAGNOSIS — D440 Neoplasm of uncertain behavior of thyroid gland: Secondary | ICD-10-CM | POA: Diagnosis not present

## 2022-12-01 DIAGNOSIS — E042 Nontoxic multinodular goiter: Secondary | ICD-10-CM | POA: Diagnosis not present

## 2022-12-01 DIAGNOSIS — Z5189 Encounter for other specified aftercare: Secondary | ICD-10-CM | POA: Diagnosis not present

## 2022-12-31 DIAGNOSIS — E890 Postprocedural hypothyroidism: Secondary | ICD-10-CM | POA: Diagnosis not present

## 2023-01-18 DIAGNOSIS — Z9889 Other specified postprocedural states: Secondary | ICD-10-CM | POA: Diagnosis not present

## 2023-01-18 DIAGNOSIS — E890 Postprocedural hypothyroidism: Secondary | ICD-10-CM | POA: Diagnosis not present

## 2023-01-18 DIAGNOSIS — D497 Neoplasm of unspecified behavior of endocrine glands and other parts of nervous system: Secondary | ICD-10-CM | POA: Diagnosis not present

## 2023-01-20 DIAGNOSIS — M25572 Pain in left ankle and joints of left foot: Secondary | ICD-10-CM | POA: Diagnosis not present

## 2023-03-01 DIAGNOSIS — E890 Postprocedural hypothyroidism: Secondary | ICD-10-CM | POA: Diagnosis not present

## 2023-03-01 DIAGNOSIS — D497 Neoplasm of unspecified behavior of endocrine glands and other parts of nervous system: Secondary | ICD-10-CM | POA: Diagnosis not present

## 2023-03-01 DIAGNOSIS — Z9889 Other specified postprocedural states: Secondary | ICD-10-CM | POA: Diagnosis not present

## 2023-04-20 DIAGNOSIS — Z9889 Other specified postprocedural states: Secondary | ICD-10-CM | POA: Diagnosis not present

## 2023-04-20 DIAGNOSIS — D497 Neoplasm of unspecified behavior of endocrine glands and other parts of nervous system: Secondary | ICD-10-CM | POA: Diagnosis not present

## 2023-04-20 DIAGNOSIS — E890 Postprocedural hypothyroidism: Secondary | ICD-10-CM | POA: Diagnosis not present

## 2023-08-25 DIAGNOSIS — R1013 Epigastric pain: Secondary | ICD-10-CM | POA: Diagnosis not present

## 2023-08-25 DIAGNOSIS — Z800 Family history of malignant neoplasm of digestive organs: Secondary | ICD-10-CM | POA: Diagnosis not present

## 2023-09-20 ENCOUNTER — Encounter: Payer: Self-pay | Admitting: Family Medicine

## 2023-09-20 ENCOUNTER — Other Ambulatory Visit: Payer: Self-pay | Admitting: Family Medicine

## 2023-09-20 DIAGNOSIS — R109 Unspecified abdominal pain: Secondary | ICD-10-CM

## 2023-09-20 DIAGNOSIS — Z800 Family history of malignant neoplasm of digestive organs: Secondary | ICD-10-CM

## 2023-09-27 DIAGNOSIS — E890 Postprocedural hypothyroidism: Secondary | ICD-10-CM | POA: Diagnosis not present

## 2023-09-27 DIAGNOSIS — G4700 Insomnia, unspecified: Secondary | ICD-10-CM | POA: Diagnosis not present

## 2023-09-27 DIAGNOSIS — Z0000 Encounter for general adult medical examination without abnormal findings: Secondary | ICD-10-CM | POA: Diagnosis not present

## 2023-09-27 DIAGNOSIS — I10 Essential (primary) hypertension: Secondary | ICD-10-CM | POA: Diagnosis not present

## 2023-09-27 DIAGNOSIS — E1122 Type 2 diabetes mellitus with diabetic chronic kidney disease: Secondary | ICD-10-CM | POA: Diagnosis not present

## 2023-09-27 DIAGNOSIS — I77819 Aortic ectasia, unspecified site: Secondary | ICD-10-CM | POA: Diagnosis not present

## 2023-09-27 DIAGNOSIS — E559 Vitamin D deficiency, unspecified: Secondary | ICD-10-CM | POA: Diagnosis not present

## 2023-09-27 DIAGNOSIS — E039 Hypothyroidism, unspecified: Secondary | ICD-10-CM | POA: Diagnosis not present

## 2023-09-27 DIAGNOSIS — Z79899 Other long term (current) drug therapy: Secondary | ICD-10-CM | POA: Diagnosis not present

## 2023-10-01 ENCOUNTER — Ambulatory Visit
Admission: RE | Admit: 2023-10-01 | Discharge: 2023-10-01 | Disposition: A | Discharge status: Home / Self Care | Source: Ambulatory Visit | Attending: Family Medicine | Admitting: Family Medicine

## 2023-10-01 DIAGNOSIS — Z800 Family history of malignant neoplasm of digestive organs: Secondary | ICD-10-CM

## 2023-10-01 DIAGNOSIS — K862 Cyst of pancreas: Secondary | ICD-10-CM | POA: Diagnosis not present

## 2023-10-01 DIAGNOSIS — R1012 Left upper quadrant pain: Secondary | ICD-10-CM | POA: Diagnosis not present

## 2023-10-01 DIAGNOSIS — K449 Diaphragmatic hernia without obstruction or gangrene: Secondary | ICD-10-CM | POA: Diagnosis not present

## 2023-10-01 DIAGNOSIS — R109 Unspecified abdominal pain: Secondary | ICD-10-CM

## 2023-10-01 MED ORDER — GADOPICLENOL 0.5 MMOL/ML IV SOLN
8.0000 mL | Freq: Once | INTRAVENOUS | Status: AC | PRN
  Administered 2023-10-01: 8 mL via INTRAVENOUS

## 2023-10-07 DIAGNOSIS — H52223 Regular astigmatism, bilateral: Secondary | ICD-10-CM | POA: Diagnosis not present

## 2023-10-19 DIAGNOSIS — E782 Mixed hyperlipidemia: Secondary | ICD-10-CM | POA: Diagnosis not present

## 2023-10-19 DIAGNOSIS — R1013 Epigastric pain: Secondary | ICD-10-CM | POA: Diagnosis not present

## 2023-10-19 DIAGNOSIS — K5730 Diverticulosis of large intestine without perforation or abscess without bleeding: Secondary | ICD-10-CM | POA: Diagnosis not present

## 2023-10-19 DIAGNOSIS — K8681 Exocrine pancreatic insufficiency: Secondary | ICD-10-CM | POA: Diagnosis not present

## 2023-10-19 DIAGNOSIS — E039 Hypothyroidism, unspecified: Secondary | ICD-10-CM | POA: Diagnosis not present

## 2023-10-19 DIAGNOSIS — R140 Abdominal distension (gaseous): Secondary | ICD-10-CM | POA: Diagnosis not present

## 2023-10-19 DIAGNOSIS — R933 Abnormal findings on diagnostic imaging of other parts of digestive tract: Secondary | ICD-10-CM | POA: Diagnosis not present
